# Patient Record
Sex: Male | Born: 1980
Health system: Southern US, Community
[De-identification: ages and names within clinical notes are randomized; demographics above are authoritative.]

## PROBLEM LIST (undated history)

## (undated) DIAGNOSIS — Z87448 Personal history of other diseases of urinary system: Secondary | ICD-10-CM

## (undated) DIAGNOSIS — J690 Pneumonitis due to inhalation of food and vomit: Secondary | ICD-10-CM

## (undated) DIAGNOSIS — F29 Unspecified psychosis not due to a substance or known physiological condition: Secondary | ICD-10-CM

## (undated) DIAGNOSIS — Z8719 Personal history of other diseases of the digestive system: Secondary | ICD-10-CM

## (undated) HISTORY — DX: Unspecified psychosis not due to a substance or known physiological condition: F29

## (undated) HISTORY — DX: Pneumonitis due to inhalation of food and vomit: J69.0

## (undated) HISTORY — DX: Personal history of other diseases of the digestive system: Z87.19

## (undated) HISTORY — DX: Personal history of other diseases of urinary system: Z87.448

---

## 2005-05-21 ENCOUNTER — Emergency Department (HOSPITAL_COMMUNITY): Admission: EM | Admit: 2005-05-21 | Discharge: 2005-05-21 | Payer: Self-pay | Admitting: Emergency Medicine

## 2005-05-23 ENCOUNTER — Emergency Department (HOSPITAL_COMMUNITY): Admission: EM | Admit: 2005-05-23 | Discharge: 2005-05-23 | Payer: Self-pay | Admitting: Emergency Medicine

## 2005-11-15 ENCOUNTER — Emergency Department (HOSPITAL_COMMUNITY): Admission: EM | Admit: 2005-11-15 | Discharge: 2005-11-15 | Payer: Self-pay | Admitting: *Deleted

## 2005-11-26 ENCOUNTER — Emergency Department (HOSPITAL_COMMUNITY): Admission: EM | Admit: 2005-11-26 | Discharge: 2005-11-26 | Payer: Self-pay | Admitting: Emergency Medicine

## 2007-03-04 ENCOUNTER — Emergency Department (HOSPITAL_COMMUNITY): Admission: EM | Admit: 2007-03-04 | Discharge: 2007-03-04 | Payer: Self-pay | Admitting: Emergency Medicine

## 2007-04-04 ENCOUNTER — Emergency Department (HOSPITAL_COMMUNITY): Admission: EM | Admit: 2007-04-04 | Discharge: 2007-04-05 | Payer: Self-pay | Admitting: Emergency Medicine

## 2007-10-21 ENCOUNTER — Ambulatory Visit: Payer: Self-pay | Admitting: Internal Medicine

## 2007-10-21 ENCOUNTER — Inpatient Hospital Stay (HOSPITAL_COMMUNITY): Admission: EM | Admit: 2007-10-21 | Discharge: 2007-11-07 | Payer: Self-pay | Admitting: Emergency Medicine

## 2007-10-21 ENCOUNTER — Ambulatory Visit: Payer: Self-pay | Admitting: *Deleted

## 2007-10-22 LAB — CONVERTED CEMR LAB
Cholesterol: 211 mg/dL
Triglycerides: 709 mg/dL

## 2007-10-29 ENCOUNTER — Ambulatory Visit: Payer: Self-pay | Admitting: Internal Medicine

## 2007-11-03 ENCOUNTER — Ambulatory Visit: Payer: Self-pay | Admitting: Internal Medicine

## 2007-11-07 ENCOUNTER — Ambulatory Visit: Payer: Self-pay | Admitting: Physical Medicine & Rehabilitation

## 2007-11-13 ENCOUNTER — Encounter (INDEPENDENT_AMBULATORY_CARE_PROVIDER_SITE_OTHER): Payer: Self-pay | Admitting: *Deleted

## 2007-11-13 DIAGNOSIS — E109 Type 1 diabetes mellitus without complications: Secondary | ICD-10-CM | POA: Insufficient documentation

## 2007-11-13 DIAGNOSIS — Z87448 Personal history of other diseases of urinary system: Secondary | ICD-10-CM

## 2007-11-13 DIAGNOSIS — Z8719 Personal history of other diseases of the digestive system: Secondary | ICD-10-CM | POA: Insufficient documentation

## 2007-11-14 ENCOUNTER — Encounter (INDEPENDENT_AMBULATORY_CARE_PROVIDER_SITE_OTHER): Payer: Self-pay | Admitting: *Deleted

## 2007-11-14 ENCOUNTER — Encounter: Payer: Self-pay | Admitting: Licensed Clinical Social Worker

## 2007-11-14 ENCOUNTER — Ambulatory Visit: Payer: Self-pay | Admitting: Internal Medicine

## 2007-11-14 DIAGNOSIS — R82998 Other abnormal findings in urine: Secondary | ICD-10-CM

## 2007-11-14 DIAGNOSIS — Z8659 Personal history of other mental and behavioral disorders: Secondary | ICD-10-CM

## 2007-11-14 DIAGNOSIS — J690 Pneumonitis due to inhalation of food and vomit: Secondary | ICD-10-CM

## 2007-11-14 LAB — CONVERTED CEMR LAB
ALT: 28 units/L (ref 0–53)
Basophils Absolute: 0 10*3/uL (ref 0.0–0.1)
Blood Glucose, Fingerstick: 220
CO2: 23 meq/L (ref 19–32)
Creatinine, Ser: 1.42 mg/dL (ref 0.40–1.50)
Eosinophils Relative: 2 % (ref 0–5)
Glucose, Bld: 206 mg/dL — ABNORMAL HIGH (ref 70–99)
HCT: 32 % — ABNORMAL LOW (ref 39.0–52.0)
Hemoglobin: 10.6 g/dL — ABNORMAL LOW (ref 13.0–17.0)
Lymphocytes Relative: 22 % (ref 12–46)
MCV: 80.2 fL (ref 78.0–100.0)
Microalb Creat Ratio: 13 mg/g (ref 0.0–30.0)
Monocytes Absolute: 0.5 10*3/uL (ref 0.1–1.0)
RDW: 15.2 % (ref 11.5–15.5)
Total Bilirubin: 0.6 mg/dL (ref 0.3–1.2)
Urine Glucose: NEGATIVE mg/dL
pH: 6 (ref 5.0–8.0)

## 2007-11-15 ENCOUNTER — Telehealth (INDEPENDENT_AMBULATORY_CARE_PROVIDER_SITE_OTHER): Payer: Self-pay | Admitting: *Deleted

## 2007-11-21 ENCOUNTER — Encounter (INDEPENDENT_AMBULATORY_CARE_PROVIDER_SITE_OTHER): Payer: Self-pay | Admitting: Internal Medicine

## 2007-11-21 ENCOUNTER — Ambulatory Visit: Payer: Self-pay | Admitting: Infectious Disease

## 2007-11-21 ENCOUNTER — Ambulatory Visit: Payer: Self-pay | Admitting: Internal Medicine

## 2007-11-21 ENCOUNTER — Encounter (INDEPENDENT_AMBULATORY_CARE_PROVIDER_SITE_OTHER): Payer: Self-pay | Admitting: *Deleted

## 2007-11-21 DIAGNOSIS — I10 Essential (primary) hypertension: Secondary | ICD-10-CM | POA: Insufficient documentation

## 2007-11-28 ENCOUNTER — Ambulatory Visit: Payer: Self-pay | Admitting: Internal Medicine

## 2007-11-28 LAB — CONVERTED CEMR LAB: Blood Glucose, Fingerstick: 101

## 2007-12-13 ENCOUNTER — Ambulatory Visit: Payer: Self-pay | Admitting: Internal Medicine

## 2007-12-13 ENCOUNTER — Encounter (INDEPENDENT_AMBULATORY_CARE_PROVIDER_SITE_OTHER): Payer: Self-pay | Admitting: Internal Medicine

## 2007-12-13 ENCOUNTER — Ambulatory Visit (HOSPITAL_COMMUNITY): Admission: RE | Admit: 2007-12-13 | Discharge: 2007-12-13 | Payer: Self-pay | Admitting: Internal Medicine

## 2007-12-13 DIAGNOSIS — I498 Other specified cardiac arrhythmias: Secondary | ICD-10-CM

## 2007-12-13 LAB — CONVERTED CEMR LAB
Calcium: 9.7 mg/dL (ref 8.4–10.5)
Creatinine, Ser: 1 mg/dL (ref 0.40–1.50)
Eosinophils Absolute: 0.2 10*3/uL (ref 0.0–0.7)
Eosinophils Relative: 2 % (ref 0–5)
Glucose, Bld: 150 mg/dL — ABNORMAL HIGH (ref 70–99)
HCT: 39.5 % (ref 39.0–52.0)
Lymphs Abs: 1.6 10*3/uL (ref 0.7–4.0)
MCV: 79.6 fL (ref 78.0–100.0)
Monocytes Absolute: 0.8 10*3/uL (ref 0.1–1.0)
Platelets: 275 10*3/uL (ref 150–400)
RDW: 13.7 % (ref 11.5–15.5)
Sodium: 140 meq/L (ref 135–145)
TSH: 1.057 microintl units/mL (ref 0.350–5.50)
WBC: 8.6 10*3/uL (ref 4.0–10.5)

## 2007-12-19 ENCOUNTER — Ambulatory Visit: Payer: Self-pay | Admitting: Internal Medicine

## 2007-12-19 ENCOUNTER — Telehealth (INDEPENDENT_AMBULATORY_CARE_PROVIDER_SITE_OTHER): Payer: Self-pay | Admitting: Pharmacy Technician

## 2007-12-19 LAB — CONVERTED CEMR LAB

## 2007-12-27 ENCOUNTER — Encounter (INDEPENDENT_AMBULATORY_CARE_PROVIDER_SITE_OTHER): Payer: Self-pay | Admitting: Internal Medicine

## 2008-02-05 ENCOUNTER — Encounter (INDEPENDENT_AMBULATORY_CARE_PROVIDER_SITE_OTHER): Payer: Self-pay | Admitting: Internal Medicine

## 2008-02-14 ENCOUNTER — Ambulatory Visit: Payer: Self-pay | Admitting: Internal Medicine

## 2008-02-17 ENCOUNTER — Encounter: Payer: Self-pay | Admitting: Internal Medicine

## 2008-07-03 ENCOUNTER — Telehealth (INDEPENDENT_AMBULATORY_CARE_PROVIDER_SITE_OTHER): Payer: Self-pay | Admitting: Internal Medicine

## 2008-07-21 ENCOUNTER — Ambulatory Visit: Payer: Self-pay | Admitting: Internal Medicine

## 2008-07-21 ENCOUNTER — Encounter (INDEPENDENT_AMBULATORY_CARE_PROVIDER_SITE_OTHER): Payer: Self-pay | Admitting: Internal Medicine

## 2008-07-21 LAB — CONVERTED CEMR LAB
Blood Glucose, Fingerstick: 162
Hgb A1c MFr Bld: 8 %

## 2008-07-23 LAB — CONVERTED CEMR LAB
CO2: 17 meq/L — ABNORMAL LOW (ref 19–32)
Calcium: 9.3 mg/dL (ref 8.4–10.5)
Chloride: 102 meq/L (ref 96–112)
Glucose, Bld: 156 mg/dL — ABNORMAL HIGH (ref 70–99)
Potassium: 4.3 meq/L (ref 3.5–5.3)
Sodium: 138 meq/L (ref 135–145)

## 2008-07-24 ENCOUNTER — Ambulatory Visit: Payer: Self-pay | Admitting: Internal Medicine

## 2008-07-24 ENCOUNTER — Encounter (INDEPENDENT_AMBULATORY_CARE_PROVIDER_SITE_OTHER): Payer: Self-pay | Admitting: Internal Medicine

## 2008-07-25 LAB — CONVERTED CEMR LAB
ALT: 38 units/L (ref 0–53)
Alkaline Phosphatase: 52 units/L (ref 39–117)
Basophils Absolute: 0 10*3/uL (ref 0.0–0.1)
Basophils Relative: 0 % (ref 0–1)
Bilirubin Urine: NEGATIVE
Creatinine, Ser: 1.2 mg/dL (ref 0.40–1.50)
Eosinophils Absolute: 0.1 10*3/uL (ref 0.0–0.7)
Eosinophils Relative: 1 % (ref 0–5)
HCT: 45 % (ref 39.0–52.0)
Hemoglobin, Urine: NEGATIVE
Hemoglobin: 14.4 g/dL (ref 13.0–17.0)
MCHC: 32 g/dL (ref 30.0–36.0)
MCV: 76.3 fL — ABNORMAL LOW (ref 78.0–100.0)
Monocytes Absolute: 0.4 10*3/uL (ref 0.1–1.0)
Platelets: 271 10*3/uL (ref 150–400)
Protein, ur: NEGATIVE mg/dL
RDW: 13.7 % (ref 11.5–15.5)
Sodium: 136 meq/L (ref 135–145)
Total Bilirubin: 0.7 mg/dL (ref 0.3–1.2)
Total Protein: 6.7 g/dL (ref 6.0–8.3)
Urine Glucose: 100 mg/dL — AB
pH: 6 (ref 5.0–8.0)

## 2008-08-11 ENCOUNTER — Encounter (INDEPENDENT_AMBULATORY_CARE_PROVIDER_SITE_OTHER): Payer: Self-pay | Admitting: Internal Medicine

## 2008-08-13 ENCOUNTER — Ambulatory Visit: Payer: Self-pay | Admitting: Internal Medicine

## 2008-08-13 ENCOUNTER — Encounter (INDEPENDENT_AMBULATORY_CARE_PROVIDER_SITE_OTHER): Payer: Self-pay | Admitting: Internal Medicine

## 2008-08-13 LAB — CONVERTED CEMR LAB: Hgb A1c MFr Bld: 8.9 %

## 2008-08-15 DIAGNOSIS — E785 Hyperlipidemia, unspecified: Secondary | ICD-10-CM

## 2008-08-15 LAB — CONVERTED CEMR LAB
AST: 25 units/L (ref 0–37)
Albumin: 4.7 g/dL (ref 3.5–5.2)
BUN: 16 mg/dL (ref 6–23)
Calcium: 9.9 mg/dL (ref 8.4–10.5)
Chloride: 103 meq/L (ref 96–112)
Cholesterol: 217 mg/dL — ABNORMAL HIGH (ref 0–200)
Creatinine, Ser: 1.02 mg/dL (ref 0.40–1.50)
Creatinine, Urine: 229.8 mg/dL
Glucose, Bld: 230 mg/dL — ABNORMAL HIGH (ref 70–99)
HDL: 35 mg/dL — ABNORMAL LOW (ref >39)
Potassium: 4.3 meq/L (ref 3.5–5.3)
Total CHOL/HDL Ratio: 6.2
Triglycerides: 251 mg/dL — ABNORMAL HIGH (ref <150)

## 2008-08-21 ENCOUNTER — Telehealth (INDEPENDENT_AMBULATORY_CARE_PROVIDER_SITE_OTHER): Payer: Self-pay | Admitting: Internal Medicine

## 2008-08-21 DIAGNOSIS — E872 Acidosis: Secondary | ICD-10-CM

## 2008-08-28 ENCOUNTER — Encounter (INDEPENDENT_AMBULATORY_CARE_PROVIDER_SITE_OTHER): Payer: Self-pay | Admitting: Internal Medicine

## 2008-08-28 ENCOUNTER — Ambulatory Visit: Payer: Self-pay | Admitting: *Deleted

## 2008-08-28 LAB — CONVERTED CEMR LAB
BUN: 16 mg/dL (ref 6–23)
CO2: 24 meq/L (ref 19–32)
Calcium: 9.5 mg/dL (ref 8.4–10.5)
Creatinine, Ser: 1.09 mg/dL (ref 0.40–1.50)
Glucose, Bld: 319 mg/dL — ABNORMAL HIGH (ref 70–99)

## 2008-09-10 ENCOUNTER — Telehealth (INDEPENDENT_AMBULATORY_CARE_PROVIDER_SITE_OTHER): Payer: Self-pay | Admitting: *Deleted

## 2008-09-10 ENCOUNTER — Ambulatory Visit: Payer: Self-pay | Admitting: *Deleted

## 2008-09-10 ENCOUNTER — Encounter (INDEPENDENT_AMBULATORY_CARE_PROVIDER_SITE_OTHER): Payer: Self-pay | Admitting: Internal Medicine

## 2008-09-10 LAB — CONVERTED CEMR LAB
BUN: 15 mg/dL (ref 6–23)
Blood Glucose, Fingerstick: 275
Calcium: 9.9 mg/dL (ref 8.4–10.5)
Chloride: 98 meq/L (ref 96–112)
Creatinine, Ser: 1.17 mg/dL (ref 0.40–1.50)

## 2008-10-13 ENCOUNTER — Encounter (INDEPENDENT_AMBULATORY_CARE_PROVIDER_SITE_OTHER): Payer: Self-pay | Admitting: Internal Medicine

## 2008-10-13 ENCOUNTER — Ambulatory Visit: Payer: Self-pay | Admitting: Internal Medicine

## 2008-10-13 LAB — CONVERTED CEMR LAB
Calcium: 9.8 mg/dL (ref 8.4–10.5)
Glucose, Bld: 252 mg/dL — ABNORMAL HIGH (ref 70–99)
Hgb A1c MFr Bld: 10.2 %
Potassium: 4.1 meq/L (ref 3.5–5.3)
Sodium: 134 meq/L — ABNORMAL LOW (ref 135–145)

## 2008-10-22 ENCOUNTER — Encounter (INDEPENDENT_AMBULATORY_CARE_PROVIDER_SITE_OTHER): Payer: Self-pay | Admitting: Internal Medicine

## 2008-11-14 ENCOUNTER — Telehealth (INDEPENDENT_AMBULATORY_CARE_PROVIDER_SITE_OTHER): Payer: Self-pay | Admitting: *Deleted

## 2008-11-26 ENCOUNTER — Telehealth: Payer: Self-pay | Admitting: *Deleted

## 2008-11-27 ENCOUNTER — Telehealth: Payer: Self-pay | Admitting: *Deleted

## 2008-12-10 ENCOUNTER — Encounter (INDEPENDENT_AMBULATORY_CARE_PROVIDER_SITE_OTHER): Payer: Self-pay | Admitting: Internal Medicine

## 2009-01-16 ENCOUNTER — Telehealth (INDEPENDENT_AMBULATORY_CARE_PROVIDER_SITE_OTHER): Payer: Self-pay | Admitting: Internal Medicine

## 2009-05-27 ENCOUNTER — Ambulatory Visit: Payer: Self-pay | Admitting: Internal Medicine

## 2009-05-27 LAB — CONVERTED CEMR LAB
Blood Glucose, Fingerstick: 368
Hgb A1c MFr Bld: 13.2 %

## 2009-05-28 ENCOUNTER — Encounter: Payer: Self-pay | Admitting: Internal Medicine

## 2009-05-28 DIAGNOSIS — R809 Proteinuria, unspecified: Secondary | ICD-10-CM | POA: Insufficient documentation

## 2009-05-28 LAB — CONVERTED CEMR LAB: Creatinine, Urine: 70.6 mg/dL

## 2009-06-11 ENCOUNTER — Ambulatory Visit: Payer: Self-pay | Admitting: Internal Medicine

## 2009-06-18 ENCOUNTER — Ambulatory Visit: Payer: Self-pay | Admitting: Internal Medicine

## 2009-06-22 ENCOUNTER — Encounter (INDEPENDENT_AMBULATORY_CARE_PROVIDER_SITE_OTHER): Payer: Self-pay | Admitting: Internal Medicine

## 2009-09-10 ENCOUNTER — Encounter (INDEPENDENT_AMBULATORY_CARE_PROVIDER_SITE_OTHER): Payer: Self-pay | Admitting: Internal Medicine

## 2009-12-11 ENCOUNTER — Ambulatory Visit: Payer: Self-pay | Admitting: Internal Medicine

## 2009-12-11 LAB — CONVERTED CEMR LAB: Hgb A1c MFr Bld: 9.5 %

## 2010-01-11 ENCOUNTER — Encounter (INDEPENDENT_AMBULATORY_CARE_PROVIDER_SITE_OTHER): Payer: Self-pay | Admitting: Internal Medicine

## 2010-01-11 ENCOUNTER — Ambulatory Visit: Payer: Self-pay | Admitting: Internal Medicine

## 2010-01-11 LAB — CONVERTED CEMR LAB
ALT: 19 units/L (ref 0–53)
AST: 13 units/L (ref 0–37)
Albumin: 4.3 g/dL (ref 3.5–5.2)
BUN: 11 mg/dL (ref 6–23)
CO2: 21 meq/L (ref 19–32)
Calcium: 9.5 mg/dL (ref 8.4–10.5)
Chloride: 102 meq/L (ref 96–112)
Potassium: 4 meq/L (ref 3.5–5.3)

## 2010-01-13 ENCOUNTER — Encounter (INDEPENDENT_AMBULATORY_CARE_PROVIDER_SITE_OTHER): Payer: Self-pay | Admitting: Internal Medicine

## 2010-02-15 ENCOUNTER — Telehealth (INDEPENDENT_AMBULATORY_CARE_PROVIDER_SITE_OTHER): Payer: Self-pay | Admitting: *Deleted

## 2010-02-15 ENCOUNTER — Ambulatory Visit: Payer: Self-pay | Admitting: Internal Medicine

## 2010-02-15 LAB — CONVERTED CEMR LAB: Blood Glucose, Fingerstick: 345

## 2010-02-17 ENCOUNTER — Telehealth: Payer: Self-pay | Admitting: Licensed Clinical Social Worker

## 2010-04-14 ENCOUNTER — Telehealth (INDEPENDENT_AMBULATORY_CARE_PROVIDER_SITE_OTHER): Payer: Self-pay | Admitting: *Deleted

## 2010-05-07 ENCOUNTER — Ambulatory Visit: Payer: Self-pay | Admitting: Internal Medicine

## 2010-05-07 DIAGNOSIS — F17200 Nicotine dependence, unspecified, uncomplicated: Secondary | ICD-10-CM | POA: Insufficient documentation

## 2010-05-07 LAB — CONVERTED CEMR LAB
Alkaline Phosphatase: 56 units/L (ref 39–117)
BUN: 9 mg/dL (ref 6–23)
Glucose, Bld: 133 mg/dL — ABNORMAL HIGH (ref 70–99)
Sodium: 138 meq/L (ref 135–145)
Total Bilirubin: 0.4 mg/dL (ref 0.3–1.2)

## 2010-05-12 ENCOUNTER — Telehealth: Payer: Self-pay | Admitting: Licensed Clinical Social Worker

## 2010-06-22 ENCOUNTER — Encounter: Payer: Self-pay | Admitting: Internal Medicine

## 2010-07-15 ENCOUNTER — Telehealth: Payer: Self-pay | Admitting: Internal Medicine

## 2010-07-17 ENCOUNTER — Emergency Department (HOSPITAL_COMMUNITY): Admission: EM | Admit: 2010-07-17 | Discharge: 2010-07-17 | Payer: Self-pay | Admitting: Emergency Medicine

## 2010-11-23 NOTE — Assessment & Plan Note (Signed)
Summary: CHECKUP/SB.   Vital Signs:  Patient profile:   30 year old male Height:      73 inches (185.42 cm) Weight:      292.8 pounds (133.09 kg) BMI:     38.77 Temp:     98.2 degrees F (36.78 degrees C) oral Pulse rate:   92 / minute BP sitting:   122 / 83  (right arm) Cuff size:   X LARGE  Vitals Entered By: Theotis Barrio NT II (December 11, 2009 3:10 PM) CC: ROUTINE OFFICE VISIT / NEED REFILL ON INSULIN  /  NO CONCERNS NOR COMPLAINTS,  Is Patient Diabetic? Yes Did you bring your meter with you today? No Pain Assessment Patient in pain? no      Nutritional Status BMI of > 30 = obese CBG Result 271  Have you ever been in a relationship where you felt threatened, hurt or afraid?No   Does patient need assistance? Functional Status Self care Ambulation Normal Comments ROUTINE OFFICE VISIT /  NEED REFILL ON INSULIN / NO CONCERNS NOR COMPLAINTS   Primary Care Provider:  Zara Council MD  CC:  ROUTINE OFFICE VISIT / NEED REFILL ON INSULIN  /  NO CONCERNS NOR COMPLAINTS and .  History of Present Illness: Connor Cook is a 30 year old Male with PMH/problems as outlined in the EMR, who presents to the [facility] for follow up on his DM. He is taking lantus and novolog as prescribed but not doing other meds, because he couldn't go to [facility] to pick them up. Feels fine and has no other complaints.   Depression History:      The patient denies a depressed mood most of the day and a diminished interest in his usual daily activities.         Preventive Screening-Counseling & Management  Alcohol-Tobacco     Alcohol type: AT TIMES  /BEER     Smoking Status: quit     Packs/Day: 1PER DAY     Year Started: AT THE AGE OF 12     Year Quit: 2008  Caffeine-Diet-Exercise     Does Patient Exercise: yes     Type of exercise: WALKING     Times/week: 1-2  Current Medications (verified): 1)  Lantus 100 Unit/ml  Soln (Insulin Glargine) .... Inject 50 Units Subcutaneously Every Night At  Bedtime For Diabetes. 2)  Fluphenazine Hcl 2.5 Mg/26ml  Elix (Fluphenazine Hcl) .... Take 0.8ml At Intervals Directed By [facility]. 3)  Novolog 100 Unit/ml  Soln (Insulin Aspart) .Marland Kitchen.. 10 Units Before Breakfast and Dinner, Subcutaneously. 4)  Lisinopril 10 Mg Tabs (Lisinopril) .... Take 1 Tablet By Mouth Once A Day 5)  Pravachol 20 Mg Tabs (Pravastatin Sodium) .... Take 1 Tab By Mouth At Bedtime  Allergies: No Known Drug Allergies  Past History:  Past Medical History: Last updated: 10/13/2008 Diabetes mellitus, type I (dx Dec 2008) w/ episode of DKA H/o acute pancreatitis H/o upper GI bleed H/o aspiration pneumonia H/o acute renal failure Psychosis (receives fluphenazine injections every 2 weeks at [facility]; contact is Merryl Hacker, labeled as Psychotic Disorder NOS).  Family History: Last updated: 11/14/2007 Mother: DM, HTN Father: deceased (age 32) of MI Siblings: 1 brother, 2 sisters Children: none  Social History: Last updated: 08/13/2008 Lives with mom in [place].  Quit few months (1ppd x 2-3y).  Drank 2-3 drinks on weekend.  No drugs.  Has a girlfriend.  Risk Factors: Exercise: yes (12/11/2009)  Risk Factors: Smoking  Status: quit (12/11/2009) Packs/Day: 1PER DAY (12/11/2009)  Review of Systems       Review of System: Negative except per HPI.    Physical Exam  General:  alert and overweight-appearing.   Head:  normocephalic and atraumatic.   Eyes:  vision grossly intact, pupils equal, pupils round, and pupils reactive to light.   Mouth:  pharynx pink and moist.   Neck:  supple.   Lungs:  normal respiratory effort and normal breath sounds.   Heart:  normal rate and regular rhythm.   Abdomen:  soft and non-tender.   Pulses:  normal peripheral pulses  Extremities:  no cyanosis, clubbing or edema  dm foot exam done today.    Impression & Recommendations:  Problem # 1:  DIABETES MELLITUS, TYPE I (ICD-250.01) Data reviewed: A1c:  9.5 (12/11/2009 3:04:05 PM)  MICROALB/CR: 420.8 (05/28/2009 1:10:00 AM) BP:  borderline LDL: 132 (08/13/2008 8:46:00 PM) EYE: No diabetic retinopathy.   Visual acuity OD : 20/20 Visual acuity OS : 20/20      Intraocular pressure OD: 20 exam by Dr. Ernst Breach. Bulakowski       (12/10/2008 10:04:32 AM) Foot exam done today.  WEIGHT: 38.77 (12/11/2009 3:04:05 PM)   Patient did not bring his meter today. I am unable to make changes without looking at his CBG numbers. I have asked the patient to bring his meters next time. His updated medication list for this problem includes:    Lantus 100 Unit/ml Soln (Insulin glargine) ..... Inject 50 units subcutaneously every night at bedtime for diabetes.    Novolog 100 Unit/ml Soln (Insulin aspart) .Marland KitchenMarland KitchenMarland KitchenMarland Kitchen 10 units before breakfast and dinner, subcutaneously.    Lisinopril 10 Mg Tabs (Lisinopril) .Marland Kitchen... Take 1 tablet by mouth once a day  Orders: T- Capillary Blood Glucose (82948) T-Hgb A1C (in-house) ([address]UE)  Problem # 2:  HYPERTENSION (ICD-401.9) BP is above ideal. Not taking lisinopril. I have counselled him on this. He is going to find somebody who can drive him to [facility] to help get the meds.  His updated medication list for this problem includes:    Lisinopril 10 Mg Tabs (Lisinopril) .Marland Kitchen... Take 1 tablet by mouth once a day  Problem # 3:  DYSLIPIDEMIA (ICD-272.4) continue statin.   His updated medication list for this problem includes:    Pravachol 20 Mg Tabs (Pravastatin sodium) .Marland Kitchen... Take 1 tab by mouth at bedtime  Complete Medication List: 1)  Lantus 100 Unit/ml Soln (Insulin glargine) .... Inject 50 units subcutaneously every night at bedtime for diabetes. 2)  Fluphenazine Hcl 2.5 Mg/88ml Elix (Fluphenazine hcl) .... Take 0.31ml at intervals directed by [facility]. 3)  Novolog 100 Unit/ml Soln (Insulin aspart) .Marland Kitchen.. 10 units before breakfast and dinner, subcutaneously. 4)  Lisinopril 10 Mg Tabs (Lisinopril) .... Take 1  tablet by mouth once a day 5)  Pravachol 20 Mg Tabs (Pravastatin sodium) .... Take 1 tab by mouth at bedtime  Patient Instructions: 1)  Please schedule a follow-up appointment in 1 month. 2)  Please take Lisinopril as prescribed for blood pressure. Available at [facility] FOR $4 PER MONTH.  Prescriptions: PRAVACHOL 20 MG TABS (PRAVASTATIN SODIUM) Take 1 tab by mouth at bedtime  #31 x 6   Entered and Authorized by:   Zara Council MD   Signed by:   Zara Council MD on 12/11/2009   Method used:   Electronically to        [facility] (retail)  [address]       [place], [place]  [address]       Ph: [phone]       Fax: [phone]   RxID:   [id number] LISINOPRIL 10 MG TABS (LISINOPRIL) Take 1 tablet by mouth once a day  #31 x 6   Entered and Authorized by:   Zara Council MD   Signed by:   Zara Council MD on 12/11/2009   Method used:   Electronically to        [facility] (retail)       [address]       [place], [place]  [address]       Ph: [phone]       Fax: [phone]   RxID:   [id number] NOVOLOG 100 UNIT/ML  SOLN (INSULIN ASPART) 10 units before breakfast and dinner, subcutaneously.  #1 vial x 6   Entered and Authorized by:   Zara Council MD   Signed by:   Zara Council MD on 12/11/2009   Method used:   Faxed to ...       [facility] (retail)       [address] [place], [place]  [address]       Ph: [phone]       Fax: [phone]   RxID:   [id number]    Prevention & Chronic Care Immunizations   Influenza vaccine: Fluvax 3+  (07/21/2008)    Tetanus booster: Not documented    Pneumococcal vaccine: Not documented  Other Screening   Smoking status: quit  (12/11/2009)  Diabetes Mellitus   HgbA1C: 9.5  (12/11/2009)   Hemoglobin A1C due: 03/11/2008    Eye exam: No diabetic retinopathy.   Visual acuity OD : 20/20 Visual acuity OS : 20/20       Intraocular pressure OD: 20 exam by Dr. Liliane Bade        (12/10/2008)   Eye exam due: 12/2009    Foot exam: Not documented   High risk foot: No  (12/11/2009)   Foot care education: Done  (11/21/2007)   Foot exam due: 12/10/2010    Urine microalbumin/creatinine ratio: 420.8  (05/28/2009)   Urine microalbumin action/deferral: Ordered   Urine microalbumin/cr due: 11/13/2008    Diabetes flowsheet reviewed?: Yes   Progress toward A1C goal: Unchanged  Lipids   Total Cholesterol: 217  (08/13/2008)   LDL: 132  (08/13/2008)   LDL Direct: Not documented   HDL: 35  (08/13/2008)   Triglycerides: 251  (08/13/2008)    SGOT (AST): 25  (08/13/2008)   SGPT (ALT): 36  (08/13/2008)   Alkaline phosphatase: 56  (08/13/2008)   Total bilirubin: 0.4  (08/13/2008)    Lipid flowsheet reviewed?: Yes   Progress toward LDL goal: Unchanged  Hypertension   Last Blood Pressure: 122 / 83  (12/11/2009)   Serum creatinine: 1.04  (10/13/2008)   Serum potassium 4.1  (10/13/2008)    Hypertension flowsheet reviewed?: Yes   Progress toward BP goal: Deteriorated  Self-Management Support :    Patient will work on the following items until the next clinic visit to reach self-care goals:     Medications and monitoring: take my medicines every day, check my blood sugar, examine my feet every day  (12/11/2009)     Eating: drink diet soda or water instead of juice  or soda, eat more vegetables, eat foods that are low in salt, eat baked foods instead of fried foods, eat fruit for snacks and desserts, limit or avoid alcohol  (12/11/2009)     Activity: take a 30 minute walk every day  (12/11/2009)    Diabetes self-management support: Not documented   Last diabetes self-management training by diabetes educator: 06/18/2009   Last medical nutrition therapy: 12/19/2007    Hypertension self-management support: Not documented    Lipid self-management support: Not documented   Laboratory Results   Blood  Tests   Date/Time Received: December 11, 2009 3:34 PM  Date/Time Reported: Burke Keels  December 11, 2009 3:34 PM   HGBA1C: 9.5%   (Normal Range: Non-Diabetic - 3-6%   Control Diabetic - 6-8%) CBG Random:: 271mg /dL      Last LDL:                                                 132 (08/13/2008 8:46:00 PM)        Diabetic Foot Exam Foot Inspection Is there a history of a foot ulcer?              No Is there a foot ulcer now?              No Can the patient see the bottom of their feet?          Yes Are the shoes appropriate in style and fit?          Yes Is there swelling or an abnormal foot shape?          No Are the toenails long?                No Are the toenails thick?                No Are the toenails ingrown?              No Is there heavy callous build-up?              No Is there a claw toe deformity?                          No Is there elevated skin temperature?            No Is there limited ankle dorsiflexion?            No Is there foot or ankle muscle weakness?            No Do you have pain in calf while walking?           No       High Risk Feet? No Set Next Diabetic Foot Exam here: 12/10/2010   10-g (5.07) Semmes-Weinstein Monofilament Test Performed by: Etana Beets MD          Right Foot          Left Foot Site 1         normal         normal Site 4         normal         normal Site 5         normal         normal Site 6  normal         normal

## 2010-11-23 NOTE — Progress Notes (Signed)
Summary: Soc. 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       Appended Document: Soc. Work Patient Connor Cook that was scheduled July 21st.

## 2010-11-23 NOTE — Letter (Signed)
Summary: DIABETIC EYE EXAMINATION REPORT  DIABETIC EYE EXAMINATION REPORT   Imported By: Margie Billet 01/28/2010 11:11:59  _____________________________________________________________________  External Attachment:    Type:   Image     Comment:   External Document  Appended Document: DIABETIC EYE EXAMINATION REPORT    Clinical Lists Changes  Observations: Added new observation of [id number]: 01/2011 (02/01/2010 8:35) Added new observation of DIAB EYE EX: No diabetic retinopathy.    (01/13/2010 8:36)       Diabetic Eye Exam  Procedure date:  01/13/2010  Findings:      No diabetic retinopathy.     Procedures Next Due Date:    Diabetic Eye Exam: 01/2011   Diabetic Eye Exam  Procedure date:  01/13/2010  Findings:      No diabetic retinopathy.     Procedures Next Due Date:    Diabetic Eye Exam: 01/2011

## 2010-11-23 NOTE — Assessment & Plan Note (Signed)
Summary: REASSIGNED-DIABETES CHECK UP/CFB   Vital Signs:  Patient profile:   30 year old male Height:      73 inches (185.42 cm) Weight:      286.7 pounds (130.32 kg) BMI:     37.96 Temp:     98.5 degrees F (36.94 degrees C) oral Pulse rate:   80 / minute BP sitting:   129 / 86  (right arm) Cuff size:   large  Vitals Entered By: Theotis Barrio NT II (May 07, 2010 4:19 PM) CC: PATIENTNT IS HERE FOR ROUTINE OFFICE VISIT Is Patient Diabetic? Yes Did you bring your meter with you today? No Pain Assessment Patient in pain? no      Nutritional Status BMI of > 30 = obese  Have you ever been in a relationship where you felt threatened, hurt or afraid?No   Does patient need assistance? Functional Status Self care Ambulation Normal Comments ROUTINE OFFICE VISIT    \]\  Primary Care Provider:  Lars Mage MD  CC:  PATIENTNT IS HERE FOR ROUTINE OFFICE VISIT.  History of Present Illness: Connor Cook is a 30 year old Male with PMH/problems as outlined in the EMR, who presents to the [facility] for follow up on his high BP and DM. He is taking his BP meds as prescribed and doing lantus once a day and novolog with meals twice day. No new complaints today. Not checking his blood sugar.  Patient was counselled on smoking cessation strategies including medications and behavior modification options. I will give him nicotine patches today.  He says that he is having cough for last 2 months, does not bring anything with it and has been getting better. He smokes about a pack per day.  No other complaints.     Preventive Screening-Counseling & Management  Alcohol-Tobacco     Alcohol type: AT TIMES  /BEER     Smoking Status: current     Smoking Cessation Counseling: yes     Packs/Day: 1PER DAY     Year Started: AT THE AGE OF 12     Year Quit: 2008  Caffeine-Diet-Exercise     Does Patient Exercise: yes     Type of exercise: WALKING     Times/week: 1-2  Current Medications  (verified): 1)  Lantus 100 Unit/ml  Soln (Insulin Glargine) .... Inject 50 Units Subcutaneously Every Night At Bedtime For Diabetes. 2)  Fluphenazine Hcl 2.5 Mg/36ml  Elix (Fluphenazine Hcl) .... Take 0.54ml At Intervals Directed By [facility]. 3)  Novolog 100 Unit/ml  Soln (Insulin Aspart) .Marland Kitchen.. 10 Units Before Breakfast and Dinner, Subcutaneously; and Add 2 Units Insulin For Every Handful of Chips Consumed. 4)  Lisinopril 10 Mg Tabs (Lisinopril) .... Take 1 Tablet By Mouth Once A Day 5)  Pravachol 20 Mg Tabs (Pravastatin Sodium) .... Take 1 Tab By Mouth At Bedtime 6)  Truetrack Test  Strp (Glucose Blood) .... Please Use It To Check Blood Sugar 2-3 Times A Day (Early Morning and Before and After Meals) 7)  Nicotine 21 Mg/24hr Pt24 (Nicotine) .... Apply To Your Skin and Change Every Day 8)  Nicotine 14 Mg/24hr Pt24 (Nicotine) .... Apply To Your Skin and Change Every Day For Next 7 Days. 9)  Nicotine 7 Mg/24hr Pt24 (Nicotine) .... Marland Kitchenapply Toskin As Directed After 14 Days of Other Patches For Next 14 Days.  Allergies (verified): No Known Drug Allergies  Family History: Reviewed history from 11/14/2007 and no changes required. Mother: DM, HTN Father: deceased (age  51) of MI Siblings: 1 brother, 2 sisters Children: none  Social History: Reviewed history from 08/13/2008 and no changes required. Lives with mom in [place].  Quit few months (1ppd x 2-3y).  Drank 2-3 drinks on weekend.  No drugs.  Has a girlfriend.Smoking Status:  current  Review of Systems      See HPI  Physical Exam  Additional Exam:  Gen: AOx3, in no acute distress Eyes: PERRL, EOMI ENT:MMM, No erythema noted in posterior pharynx Neck: No JVD, No LAP Chest: CTAB with  good respiratory effort CVS: regular rhythmic rate, NO M/R/G, S1 S2 normal Abdo: soft,ND, BS+x4, Non tender and No hepatosplenomegaly EXT: No odema noted Neuro: Non focal, gait is normal Skin: no rashes noted.    Impression &  Recommendations:  Problem # 1:  TOBACCO ABUSE (ICD-305.1) Assessment Comment Only Patient was counseled on smoking cessation strategies including medications and behavior modification options. He wants to try nicotine patches, he quit in 2008 and thinks that he can do it again.  Social worker referral given but she was not available at the time. She will call her later and set up an appointment.  His updated medication list for this problem includes:    Nicotine 21 Mg/24hr Pt24 (Nicotine) .Marland Kitchen... Apply to your skin and change every day    Nicotine 14 Mg/24hr Pt24 (Nicotine) .Marland Kitchen... Apply to your skin and change every day for next 7 days.    Nicotine 7 Mg/24hr Pt24 (Nicotine) ..... Marland Kitchenapply toskin as directed after 14 days of other patches for next 14 days.  Orders: Social Work Referral (Social )  Encouraged smoking cessation and discussed different methods for smoking cessation.   Problem # 2:  DIABETES MELLITUS, TYPE I (ICD-250.01) Assessment: Improved Patient is doing better with HBa1c 7.7 today. Optha exam recent. Already on Lisinopril. Foot exam done today with sensations and pulses normal. BP is well maintained.  His updated medication list for this problem includes:    Lantus 100 Unit/ml Soln (Insulin glargine) ..... Inject 50 units subcutaneously every night at bedtime for diabetes.    Novolog 100 Unit/ml Soln (Insulin aspart) .Marland KitchenMarland KitchenMarland KitchenMarland Kitchen 10 units before breakfast and dinner, subcutaneously; and add 2 units insulin for every handful of chips consumed.    Lisinopril 10 Mg Tabs (Lisinopril) .Marland Kitchen... Take 1 tablet by mouth once a day  Orders: T-Hgb A1C (in-house) (17510CH) T-CMP with Estimated GFR ([id number])  Labs Reviewed: Creat: 0.94 (01/11/2010)     Last Eye Exam: No diabetic retinopathy.    (01/13/2010) Reviewed HgBA1c results: 7.7 (05/07/2010)  9.5 (12/11/2009)  Problem # 3:  HYPERTENSION (ICD-401.9) Patient is taking Lisionopril and BP is well maintained at this time.  Diastolic is above the goal for a diabetic but I will not any meds at this time as the new guidelines are due.  His updated medication list for this problem includes:    Lisinopril 10 Mg Tabs (Lisinopril) .Marland Kitchen... Take 1 tablet by mouth once a day  Orders: T-CMP with Estimated GFR ([id number])  BP today: 129/86 Prior BP: 122/81 (02/15/2010)  Labs Reviewed: K+: 4.0 (01/11/2010) Creat: : 0.94 (01/11/2010)   Chol: 217 (08/13/2008)   HDL: 35 (08/13/2008)   LDL: 132 (08/13/2008)   TG: 251 (08/13/2008)  Problem # 4:  Preventive Health Care (ICD-V70.0) Assessment: Comment Only  Reviewed preventive care protocols, scheduled due services, and updated immunizations.  Complete Medication List: 1)  Lantus 100 Unit/ml Soln (Insulin glargine) .... Inject 50 units subcutaneously every night at bedtime for diabetes. 2)  Fluphenazine Hcl 2.5 Mg/10ml Elix (Fluphenazine hcl) .... Take 0.62ml at intervals directed by [facility]. 3)  Novolog 100 Unit/ml Soln (Insulin aspart) .Marland Kitchen.. 10 units before breakfast and dinner, subcutaneously; and add 2 units insulin for every handful of chips consumed. 4)  Lisinopril 10 Mg Tabs (Lisinopril) .... Take 1 tablet by mouth once a day 5)  Pravachol 20 Mg Tabs (Pravastatin sodium) .... Take 1 tab by mouth at bedtime 6)  Truetrack Test Strp (Glucose blood) .... Please use it to check blood sugar 2-3 times a day (early morning and before and after meals) 7)  Nicotine 21 Mg/24hr Pt24 (Nicotine) .... Apply to your skin and change every day 8)  Nicotine 14 Mg/24hr Pt24 (Nicotine) .... Apply to your skin and change every day for next 7 days. 9)  Nicotine 7 Mg/24hr Pt24 (Nicotine) .... Marland Kitchenapply toskin as directed after 14 days of other patches for next 14 days.  Patient Instructions: 1)  Please schedule a follow-up appointment in 3 months. 2)  Limit your Sodium (Salt). 3)  1800-QUITNOW  is the helpline for smoking. 4)  Tobacco is very bad for your health and your  loved ones! You Should stop smoking!. 5)  Stop Smoking Tips: Choose a Quit date. Cut down before the Quit date. decide what you will do as a substitute when you feel the urge to smoke(gum,toothpick,exercise). 6)  It is important that you exercise regularly at least 20 minutes 5 times a week. If you develop chest pain, have severe difficulty breathing, or feel very tired , stop exercising immediately and seek medical attention. 7)  You need to lose weight. Consider a lower calorie diet and regular exercise.  8)  Check your blood sugars regularly. If your readings are usually above : or below 70 you should contact our office. 9)  It is important that your Diabetic A1c level is checked every 3 months. 10)  See your eye doctor yearly to check for diabetic eye damage. 11)  Check your feet each night for sore areas, calluses or signs of infection. 12)  Check your Blood Pressure regularly. If it is above: you should make an appointment. 13)  Acute bronchitis symptoms for less than 10 days are not helped by antibiotics. take over the counter cough medications. call if no improvment in  5-7 days, sooner if increasing cough, fever, or new symptoms( shortness of breath, chest pain). 14)  Lipid Panel prior to visit, ICD-9: 15)  HbgA1C prior to visit, ICD-9: Prescriptions: NICOTINE 7 MG/24HR PT24 (NICOTINE) .apply toskin as directed after 14 days of other patches for next 14 days.  #14 x 0   Entered and Authorized by:   Lars Mage MD   Signed by:   Lars Mage MD on 05/07/2010   Method used:   Print then Give to Patient   RxID:   [id number] NICOTINE 14 MG/24HR PT24 (NICOTINE) apply to your skin and change every day for next 7 days.  #7 x 0   Entered and Authorized by:   Lars Mage MD   Signed by:   Lars Mage MD on 05/07/2010   Method used:   Print then Give to Patient   RxID:   [id number] NICOTINE 21 MG/24HR PT24 (NICOTINE) Apply to your skin and change every day  #7 x 0   Entered and  Authorized by:   Lars Mage MD   Signed by:   Lars Mage MD on 05/07/2010   Method used:   Print then  Give to Patient   RxID:   [id number]  Process Orders Check Orders Results:     [facility]: ABN not required for this insurance Tests Sent for requisitioning (May 10, 2010 9:46 AM):     05/07/2010: [facility] -- T-CMP with Estimated GFR [[id number]] (signed)    Prevention & Chronic Care Immunizations   Influenza vaccine: Fluvax 3+  (07/21/2008)   Influenza vaccine deferral: Deferred  (05/07/2010)    Tetanus booster: Not documented   Td booster deferral: Deferred  (05/07/2010)    Pneumococcal vaccine: Not documented  Other Screening   Smoking status: current  (05/07/2010)   Smoking cessation counseling: yes  (05/07/2010)  Diabetes Mellitus   HgbA1C: 7.7  (05/07/2010)   Hemoglobin A1C due: 03/11/2008    Eye exam: No diabetic retinopathy.     (01/13/2010)   Eye exam due: 01/2011    Foot exam: Not documented   Foot exam action/deferral: Deferred   High risk foot: No  (12/11/2009)   Foot care education: Done  (11/21/2007)   Foot exam due: 12/10/2010    Urine microalbumin/creatinine ratio: 420.8  (05/28/2009)   Urine microalbumin action/deferral: Ordered   Urine microalbumin/cr due: 11/13/2008    Diabetes flowsheet reviewed?: Yes   Progress toward A1C goal: At goal  Lipids   Total Cholesterol: 217  (08/13/2008)   Lipid panel action/deferral: Deferred   LDL: 132  (08/13/2008)   LDL Direct: Not documented   HDL: 35  (08/13/2008)   Triglycerides: 251  (08/13/2008)    SGOT (AST): 13  (01/11/2010)   SGPT (ALT): 19  (01/11/2010)   Alkaline phosphatase: 53  (01/11/2010)   Total bilirubin: 0.5  (01/11/2010)    Lipid flowsheet reviewed?: Yes   Progress toward LDL goal: At goal  Hypertension   Last Blood Pressure: 129 / 86  (05/07/2010)   Serum creatinine: 0.94  (01/11/2010)   Serum potassium 4.0  (01/11/2010)     Hypertension flowsheet reviewed?: Yes   Progress toward BP goal: At goal  Self-Management Support :   Personal Goals (by the next clinic visit) :     Personal A1C goal: 6  (01/11/2010)     Personal blood pressure goal: 130/80  (01/11/2010)     Personal LDL goal: 100  (01/11/2010)    Patient will work on the following items until the next clinic visit to reach self-care goals:     Medications and monitoring: take my medicines every day, check my blood sugar, examine my feet every day  (05/07/2010)     Eating: drink diet soda or water instead of juice or soda, eat more vegetables, use fresh or frozen vegetables, eat foods that are low in salt, eat baked foods instead of fried foods, eat fruit for snacks and desserts, limit or avoid alcohol  (05/07/2010)     Activity: take the stairs instead of the elevator  (05/07/2010)    Diabetes self-management support: Resources for patients handout, Written self-care plan  (05/07/2010)   Diabetes care plan printed   Last diabetes self-management training by diabetes educator: 06/18/2009   Last medical nutrition therapy: 12/19/2007    Hypertension self-management support: Resources for patients handout, Written self-care plan  (05/07/2010)   Hypertension self-care plan printed.    Lipid self-management support: Resources for patients handout, Written self-care plan  (05/07/2010)   Lipid self-care plan printed.      Resource handout printed.  Process Orders Check Orders Results:     [facility]: ABN not required  for this insurance Tests Sent for requisitioning (May 10, 2010 9:46 AM):     05/07/2010: [facility] -- T-CMP with Estimated GFR [[id number]] (signed)      Laboratory Results   Blood Tests   Date/Time Received: May 07, 2010 5:03 PM  Date/Time Reported: Burke Keels  May 07, 2010 5:03 PM   HGBA1C: 7.7%   (Normal Range: Non-Diabetic - 3-6%   Control Diabetic - 6-8%)

## 2010-11-23 NOTE — Miscellaneous (Signed)
Summary: [facility]   Imported By: Margie Billet 07/08/2010 13:37:27  _____________________________________________________________________  External Attachment:    Type:   Image     Comment:   External Document

## 2010-11-23 NOTE — Progress Notes (Signed)
Summary: med refill/gp  Phone Note Refill Request Message from:  Patient on July 15, 2010 12:05 PM  Refills Requested: Medication #1:  NOVOLOG 100 UNIT/ML  SOLN 10 units before breakfast and dinner   Dosage confirmed as above?Dosage Confirmed   Brand Name Necessary? No   Supply Requested: 1 year Last appt. July 15.   Method Requested: Electronic Initial call taken by: Chinita Pester RN,  July 15, 2010 12:05 PM  Follow-up for Phone Call        Refill approved-nurse to complete Follow-up by: Lars Mage MD,  July 19, 2010 8:31 AM    Prescriptions: NOVOLOG 100 UNIT/ML  SOLN (INSULIN ASPART) 10 units before breakfast and dinner, subcutaneously; and add 2 units insulin for every handful of chips consumed.  #1 x prn   Entered and Authorized by:   Lars Mage MD   Signed by:   Lars Mage MD on 07/19/2010   Method used:   [facility]       [address]       [place], [place]  [address]       Ph: [phone]       Fax: [phone]   RxID:   [id number]

## 2010-11-23 NOTE — Miscellaneous (Signed)
Summary: Orders Update  Clinical Lists Changes  Orders: Added new Referral order of Ophthalmology Referral (Ophthalmology) - Signed    PATIENT HAS APPT  WITH [facility] 3/23/011 @ 10:30AM Connor Cook NTII

## 2010-11-23 NOTE — Progress Notes (Signed)
Summary: diabetes support/dmr  Phone Note Outgoing Call   Summary of Call: met with patient today about self monitoring his blood sugars: called [facility]:never signed forms and was disenrolled 11/2009 no lantus was ever ordered, they have no idea  if he brings in financcial information they need they can give him a sample of lantus- taxes, employment, disabliity, etc..  refill history of insulin:  Jun 12, 2009 [facility] got rx for insulin and he got 1 vial novolog- no lantus 1 vial novolog in dec 2010, jan-, feb and march  Follow-up for Phone Call        TRied to contact patient about insulin and diabetes testing supplies. spoke with patient's mother: patient cannot be reached at her number (the one we have listed for him). She says he has a cell phone, but does not have any minutes. I left a message with her for him to call me at his convenience.  Follow-up by: Jamison Neighbor RD,CDE,  February 17, 2010 4:06 PM

## 2010-11-23 NOTE — Assessment & Plan Note (Signed)
Summary: F/U/EST/VS   Vital Signs:  Patient profile:   30 year old male Height:      73 inches (185.42 cm) Weight:      298.05 pounds (135.48 kg) BMI:     39.47 Temp:     99 degrees F (37.22 degrees C) oral Pulse rate:   97 / minute BP sitting:   122 / 81  (left arm)  Vitals Entered By: Angelina Ok RN (February 15, 2010 2:21 PM) Is Patient Diabetic? Yes Did you bring your meter with you today? No Pain Assessment Patient in pain? no      Nutritional Status BMI of > 30 = obese CBG Result 345  Have you ever been in a relationship where you felt threatened, hurt or afraid?No   Does patient need assistance? Functional Status Self care Ambulation Normal Comments Needs refill on Lantus.  Check up   Primary Care Provider:  Zara Council MD   History of Present Illness: Connor Cook is a 30 year old Male with PMH/problems as outlined in the EMR, who presents to the [facility] for follow up on his high BP and DM. He is taking his BP meds as prescribed and doing lantus once a day and novolog with meals twice day. No new complaints today. Not checking his blood sugar.   Depression History:      The patient denies a depressed mood most of the day and a diminished interest in his usual daily activities.         Preventive Screening-Counseling & Management  Alcohol-Tobacco     Alcohol type: AT TIMES  /BEER     Smoking Status: quit     Packs/Day: 1PER DAY     Year Started: AT THE AGE OF 12     Year Quit: 2008  Allergies: No Known Drug Allergies   Impression & Recommendations:  Problem # 1:  DIABETES MELLITUS, TYPE I (ICD-250.01) Ms. Victory Dakin kindly gave instructions to the patient about checking his CBG. I have made refills on his trutrack meter. I will review his CBG numbers on next visit and make changes.   Addendum: Ms. Victory Dakin recommended adding sliding insulin to patient's snacks. Patient has been instructed to use 2 units insulin to every handful of chips that he takes.    His updated medication list for this problem includes:    Lantus 100 Unit/ml Soln (Insulin glargine) ..... Inject 50 units subcutaneously every night at bedtime for diabetes.    Novolog 100 Unit/ml Soln (Insulin aspart) .Marland KitchenMarland KitchenMarland KitchenMarland Kitchen 10 units before breakfast and dinner, subcutaneously; and add 2 units insulin for every handful of chips consumed.    Lisinopril 10 Mg Tabs (Lisinopril) .Marland Kitchen... Take 1 tablet by mouth once a day  Problem # 2:  HYPERTENSION (ICD-401.9) Improved and about baseline. Continue current regimen.  His updated medication list for this problem includes:    Lisinopril 10 Mg Tabs (Lisinopril) .Marland Kitchen... Take 1 tablet by mouth once a day  Problem # 3:  DYSLIPIDEMIA (ICD-272.4) Continue with pravachol.   Chol: 217 (08/13/2008 8:46:00 PM)HDL:  35 (08/13/2008 8:46:00 PM)LDL:  132 (08/13/2008 8:46:00 PM)Tri:   AST:  13 (01/11/2010 8:48:00 PM) ALT:  19 (01/11/2010 8:48:00 PM)T. Bili:  0.5 (01/11/2010 8:48:00 PM) AP:  53 (01/11/2010 8:48:00 PM)  Will need FLP repeat.  His updated medication list for this problem includes:    Pravachol 20 Mg Tabs (Pravastatin sodium) .Marland Kitchen... Take 1 tab by mouth at bedtime  Complete Medication List: 1)  Lantus 100  Unit/ml Soln (Insulin glargine) .... Inject 50 units subcutaneously every night at bedtime for diabetes. 2)  Fluphenazine Hcl 2.5 Mg/77ml Elix (Fluphenazine hcl) .... Take 0.59ml at intervals directed by [facility]. 3)  Novolog 100 Unit/ml Soln (Insulin aspart) .Marland Kitchen.. 10 units before breakfast and dinner, subcutaneously; and add 2 units insulin for every handful of chips consumed. 4)  Lisinopril 10 Mg Tabs (Lisinopril) .... Take 1 tablet by mouth once a day 5)  Pravachol 20 Mg Tabs (Pravastatin sodium) .... Take 1 tab by mouth at bedtime 6)  Truetrack Test Strp (Glucose blood) .... Please use it to check blood sugar 2-3 times a day (early morning and before and after meals)  Other Orders: Capillary Blood Glucose/CBG ([id number])  Patient  Instructions: 1)  Please schedule a follow-up appointment in 1 month. 2)   Please bring in your glucose meter with you when you come.  Prescriptions: LANTUS 100 UNIT/ML  SOLN (INSULIN GLARGINE) Inject 50 units subcutaneously every night at bedtime for diabetes.  #2 vials x 11   Entered and Authorized by:   Zara Council MD   Signed by:   Zara Council MD on 02/15/2010   Method used:   Print then Give to Patient   RxID:   [id number] TRUETRACK TEST  STRP (GLUCOSE BLOOD) Please use it to check blood sugar 2-3 times a day (early morning and before and after meals)  #1 box x 11   Entered and Authorized by:   Zara Council MD   Signed by:   Zara Council MD on 02/15/2010   Method used:   Print then Give to Patient   RxID:   [id number]   Prevention & Chronic Care Immunizations   Influenza vaccine: Fluvax 3+  (07/21/2008)    Tetanus booster: Not documented    Pneumococcal vaccine: Not documented  Other Screening   Smoking status: quit  (02/15/2010)  Diabetes Mellitus   HgbA1C: 9.5  (12/11/2009)   Hemoglobin A1C due: 03/11/2008    Eye exam: No diabetic retinopathy.     (01/13/2010)   Eye exam due: 01/2011    Foot exam: Not documented   High risk foot: No  (12/11/2009)   Foot care education: Done  (11/21/2007)   Foot exam due: 12/10/2010    Urine microalbumin/creatinine ratio: 420.8  (05/28/2009)   Urine microalbumin action/deferral: Ordered   Urine microalbumin/cr due: 11/13/2008    Diabetes flowsheet reviewed?: Yes   Progress toward A1C goal: Unchanged  Lipids   Total Cholesterol: 217  (08/13/2008)   LDL: 132  (08/13/2008)   LDL Direct: Not documented   HDL: 35  (08/13/2008)   Triglycerides: 251  (08/13/2008)    SGOT (AST): 13  (01/11/2010)   SGPT (ALT): 19  (01/11/2010)   Alkaline phosphatase: 53  (01/11/2010)   Total bilirubin: 0.5  (01/11/2010)    Lipid flowsheet reviewed?: Yes   Progress toward LDL goal: Unchanged  Hypertension   Last Blood  Pressure: 122 / 81  (02/15/2010)   Serum creatinine: 0.94  (01/11/2010)   Serum potassium 4.0  (01/11/2010)    Hypertension flowsheet reviewed?: Yes   Progress toward BP goal: Improved  Self-Management Support :   Personal Goals (by the next clinic visit) :     Personal A1C goal: 6  (01/11/2010)     Personal blood pressure goal: 130/80  (01/11/2010)     Personal LDL goal: 100  (01/11/2010)    Patient will work on the following items until the next clinic visit  to reach self-care goals:     Medications and monitoring: take my medicines every day, check my blood sugar, bring all of my medications to every visit, examine my feet every day  (02/15/2010)     Eating: drink diet soda or water instead of juice or soda, eat more vegetables, eat foods that are low in salt, eat baked foods instead of fried foods, eat fruit for snacks and desserts, limit or avoid alcohol  (02/15/2010)     Activity: take a 30 minute walk every day  (02/15/2010)    Diabetes self-management support: CBG self-monitoring log, Education handout, Resources for patients handout, Written self-care plan  (02/15/2010)   Diabetes care plan printed   Diabetes education handout printed   Last diabetes self-management training by diabetes educator: 06/18/2009   Last medical nutrition therapy: 12/19/2007    Hypertension self-management support: CBG self-monitoring log, Education handout, Resources for patients handout, Written self-care plan  (02/15/2010)   Hypertension self-care plan printed.   Hypertension education handout printed    Lipid self-management support: CBG self-monitoring log, Education handout, Resources for patients handout, Written self-care plan  (02/15/2010)   Lipid self-care plan printed.   Lipid education handout printed      Resource handout printed.     Vital Signs:  Patient profile:   30 year old male Height:      73 inches (185.42 cm) Weight:      298.05 pounds (135.48 kg) BMI:      39.47 Temp:     99 degrees F (37.22 degrees C) oral Pulse rate:   97 / minute BP sitting:   122 / 81  (left arm)  Vitals Entered By: Angelina Ok RN (February 15, 2010 2:21 PM)

## 2010-11-23 NOTE — Assessment & Plan Note (Signed)
Summary: F/U/EST/VS   Vital Signs:  Patient profile:   30 year old male Height:      73 inches Weight:      300.5 pounds BMI:     39.79 Temp:     98.0 degrees F oral Pulse rate:   78 / minute BP sitting:   125 / 85  (right arm)  Vitals Entered By: Theotis Barrio NT II (January 11, 2010 3:16 PM) CC: HERE FOR ONE MONTH FOLLOW UP  /  DM EYE APPT IS MARCH 23.011 WITH [facility] Is Patient Diabetic? Yes Did you bring your meter with you today? No Pain Assessment Patient in pain? no      Nutritional Status BMI of > 30 = obese  Have you ever been in a relationship where you felt threatened, hurt or afraid?No   Does patient need assistance? Functional Status Self care Ambulation Normal Comments HERE FOR ONE MONTH FOLLOW UP APPT /  DM EYE APPT IS MARCH 23, 011 AT [facility].   Primary Care Provider:  Zara Council MD  CC:  HERE FOR ONE MONTH FOLLOW UP  /  DM EYE APPT IS MARCH 23.011 WITH [facility].  History of Present Illness: Mr. Pitstick comes back to the clinic for follow up on his diabetes. Overall doing okay, but he seems to have gained some weight. Using lantus 50 once a day. CBG's mostly about 200. No new complaints.  Did not bring his meter with him today.   Preventive Screening-Counseling & Management  Alcohol-Tobacco     Alcohol type: AT TIMES  /BEER     Smoking Status: quit     Packs/Day: 1PER DAY     Year Started: AT THE AGE OF 12     Year Quit: 2008  Caffeine-Diet-Exercise     Does Patient Exercise: yes     Type of exercise: WALKING     Times/week: 1-2  Current Medications (verified): 1)  Lantus 100 Unit/ml  Soln (Insulin Glargine) .... Inject 50 Units Subcutaneously Every Night At Bedtime For Diabetes. 2)  Fluphenazine Hcl 2.5 Mg/77ml  Elix (Fluphenazine Hcl) .... Take 0.30ml At Intervals Directed By [facility]. 3)  Novolog 100 Unit/ml  Soln (Insulin Aspart) .Marland Kitchen.. 10 Units Before Breakfast and Dinner, Subcutaneously. 4)  Lisinopril  10 Mg Tabs (Lisinopril) .... Take 1 Tablet By Mouth Once A Day 5)  Pravachol 20 Mg Tabs (Pravastatin Sodium) .... Take 1 Tab By Mouth At Bedtime  Allergies (verified): No Known Drug Allergies  Past History:  Past Medical History: Last updated: 10/13/2008 Diabetes mellitus, type I (dx Dec 2008) w/ episode of DKA H/o acute pancreatitis H/o upper GI bleed H/o aspiration pneumonia H/o acute renal failure Psychosis (receives fluphenazine injections every 2 weeks at [facility]; contact is Merryl Hacker, labeled as Psychotic Disorder NOS).  Family History: Last updated: 11/14/2007 Mother: DM, HTN Father: deceased (age 1) of MI Siblings: 1 brother, 2 sisters Children: none  Social History: Last updated: 08/13/2008 Lives with mom in [place].  Quit few months (1ppd x 2-3y).  Drank 2-3 drinks on weekend.  No drugs.  Has a girlfriend.  Risk Factors: Exercise: yes (01/11/2010)  Risk Factors: Smoking Status: quit (01/11/2010) Packs/Day: 1PER DAY (01/11/2010)  Review of Systems       as per HPI  Physical Exam  General:  alert and overweight-appearing.   Mouth:  pharynx pink and moist.   Lungs:  normal respiratory effort and normal breath sounds.  Heart:  normal rate and regular rhythm.   Abdomen:  soft and non-tender.   Pulses:  normal peripheral pulses  Neurologic:  non focal Psych:  Oriented X3 and memory intact for recent and remote.  Very flat affect.   Impression & Recommendations:  Problem # 1:  DIABETES MELLITUS, TYPE I (ICD-250.01) DATA REVIEWED A1c: 9.5 (12/11/2009 3:04:05 PM)  MICROALB/CR: 420.8 (05/28/2009 1:10:00 AM) LDL: 132 (08/13/2008 8:46:00 PM) EYE: new referral today FOOT: up todate WEIGHT: 39.79 (01/11/2010 3:01:56 PM)   Random CBG 149. I have asked him to bring his glucose meter next time, so that we can make adjustments to his insulin dose.  Orders: T-Comprehensive Metabolic Panel ([id number])  Problem # 2:  HYPERTENSION  (ICD-401.9)  Diastolic borderline but improved from last time. No changes today.    His updated medication list for this problem includes:    Lisinopril 10 Mg Tabs (Lisinopril) .Marland Kitchen... Take 1 tablet by mouth once a day  Orders: T-Comprehensive Metabolic Panel ([id number])  Problem # 3:  DYSLIPIDEMIA (ICD-272.4)  Chol: 217 (08/13/2008 8:46:00 PM)HDL:  35 (08/13/2008 8:46:00 PM)LDL:  132 (08/13/2008 8:46:00 PM)Tri:   AST:  25 (08/13/2008 8:46:00 PM) ALT:  36 (08/13/2008 8:46:00 PM)T. Bili:  0.4 (08/13/2008 8:46:00 PM) AP:  56 (08/13/2008 8:46:00 PM)  CONTINUE PRAVACHOL.  His updated medication list for this problem includes:    Pravachol 20 Mg Tabs (Pravastatin sodium) .Marland Kitchen... Take 1 tab by mouth at bedtime  Orders: T-Comprehensive Metabolic Panel ([id number])  Complete Medication List: 1)  Lantus 100 Unit/ml Soln (Insulin glargine) .... Inject 50 units subcutaneously every night at bedtime for diabetes. 2)  Fluphenazine Hcl 2.5 Mg/21ml Elix (Fluphenazine hcl) .... Take 0.28ml at intervals directed by [facility]. 3)  Novolog 100 Unit/ml Soln (Insulin aspart) .Marland Kitchen.. 10 units before breakfast and dinner, subcutaneously. 4)  Lisinopril 10 Mg Tabs (Lisinopril) .... Take 1 tablet by mouth once a day 5)  Pravachol 20 Mg Tabs (Pravastatin sodium) .... Take 1 tab by mouth at bedtime  Patient Instructions: 1)  Please sign up for Ms. Lupita Leash Riley's healthy life style class.  2)    3)  Please schedule a follow-up appointment in 1 month. Please bring your glucose meter with you when come.  4)  We will let you know if anything wrong with your lab work.   Process Orders Check Orders Results:     [facility]: ABN not required for this insurance Order queued for requisitioning for [facility]: January 11, 2010 3:37 PM  Tests Sent for requisitioning (January 11, 2010 3:37 PM):     01/11/2010: [facility] -- T-Comprehensive Metabolic Panel [phone]  (signed)    Prevention & Chronic Care Immunizations   Influenza vaccine: Fluvax 3+  (07/21/2008)    Tetanus booster: Not documented    Pneumococcal vaccine: Not documented  Other Screening   Smoking status: quit  (01/11/2010)  Diabetes Mellitus   HgbA1C: 9.5  (12/11/2009)   Hemoglobin A1C due: 03/11/2008    Eye exam: No diabetic retinopathy.   Visual acuity OD : 20/20 Visual acuity OS : 20/20      Intraocular pressure OD: 20 exam by Dr. Liliane Bade        (12/10/2008)   Eye exam due: 12/2009    Foot exam: Not documented   High risk foot: No  (12/11/2009)   Foot care education: Done  (11/21/2007)   Foot exam due: 12/10/2010    Urine microalbumin/creatinine ratio: 420.8  (05/28/2009)  Urine microalbumin action/deferral: Ordered   Urine microalbumin/cr due: 11/13/2008    Diabetes flowsheet reviewed?: Yes   Progress toward A1C goal: Unchanged  Lipids   Total Cholesterol: 217  (08/13/2008)   LDL: 132  (08/13/2008)   LDL Direct: Not documented   HDL: 35  (08/13/2008)   Triglycerides: 251  (08/13/2008)    SGOT (AST): 25  (08/13/2008)   SGPT (ALT): 36  (08/13/2008) CMP ordered    Alkaline phosphatase: 56  (08/13/2008)   Total bilirubin: 0.4  (08/13/2008)    Lipid flowsheet reviewed?: Yes   Progress toward LDL goal: Unchanged  Hypertension   Last Blood Pressure: 125 / 85  (01/11/2010)   Serum creatinine: 1.04  (10/13/2008)   Serum potassium 4.1  (10/13/2008) CMP ordered     Hypertension flowsheet reviewed?: Yes   Progress toward BP goal: Unchanged  Self-Management Support :   Personal Goals (by the next clinic visit) :     Personal A1C goal: 6  (01/11/2010)     Personal blood pressure goal: 130/80  (01/11/2010)     Personal LDL goal: 100  (01/11/2010)    Patient will work on the following items until the next clinic visit to reach self-care goals:     Medications and monitoring: take my medicines every day, check my blood sugar, bring all of my  medications to every visit, examine my feet every day  (01/11/2010)     Eating: drink diet soda or water instead of juice or soda, eat more vegetables, use fresh or frozen vegetables, eat foods that are low in salt, eat baked foods instead of fried foods, eat fruit for snacks and desserts, limit or avoid alcohol  (01/11/2010)     Activity: take a 30 minute walk every day  (01/11/2010)    Diabetes self-management support: Resources for patients handout  (01/11/2010)   Last diabetes self-management training by diabetes educator: 06/18/2009   Last medical nutrition therapy: 12/19/2007    Hypertension self-management support: Resources for patients handout  (01/11/2010)    Lipid self-management support: Resources for patients handout  (01/11/2010)        Resource handout printed.

## 2010-11-23 NOTE — Progress Notes (Signed)
Summary: Soc. Work  Nurse, children's placed by: Dorothe Pea, LCSW Call placed to: Mental Health Summary of Call: Called area mental health Marnette Burgess nurse) who advised me that they have tried to connect [place] to various resources in the community including [employer] and community support case mgmt but neither he nor his mother have been interested in following through.    We should get another release from [facility] for updated [facility] records as well as talk to the family directly about community programs.  I will meet with Earl Lites at next clinic appmt or at the same time Lupita Leash sees [place] again.

## 2010-11-23 NOTE — Progress Notes (Signed)
Summary: needs appointment with PCP/dmr  Phone Note Outgoing Call   Call placed by: Jamison Neighbor RD,CDE,  April 14, 2010 9:32 AM Summary of Call: Called patient to because he is due for an appointment with his doctor and has a high A1C. His mother said to call him on his cell phone [phone] and to call her back if this number doesn't work.   Follow-up for Phone Call        Tried calling patient, a male answered and then phone was disconnected. Called back and left a message for Connor Cook to call us at either [phone] or [phone] to reschedule an appoitnment Follow-up by: Jamison Neighbor RD,CDE,  April 14, 2010 10:06 AM  Additional Follow-up for Phone Call Additional follow up Details #1::        Contacted patient this morning and he has made an appointment for next month with his PCP Dr. Eben Burow. Pt confirms he will be here for appointment on 05/07/2010 at 4:00p.m. Additional Follow-up by: Shon Hough,  April 15, 2010 9:19 AM    Additional Follow-up for Phone Call Additional follow up Details #2::    thank you! Follow-up by: Jamison Neighbor RD,CDE,  April 15, 2010 10:45 AM

## 2010-12-21 ENCOUNTER — Encounter: Payer: Self-pay | Admitting: Internal Medicine

## 2011-01-11 LAB — GLUCOSE, CAPILLARY

## 2011-01-12 LAB — GLUCOSE, CAPILLARY: Glucose-Capillary: 271 mg/dL — ABNORMAL HIGH (ref 70–99)

## 2011-01-16 LAB — GLUCOSE, CAPILLARY: Glucose-Capillary: 149 mg/dL — ABNORMAL HIGH (ref 70–99)

## 2011-01-29 LAB — GLUCOSE, CAPILLARY: Glucose-Capillary: 288 mg/dL — ABNORMAL HIGH (ref 70–99)

## 2011-03-08 NOTE — Consult Note (Signed)
Connor Cook, Connor Cook            ACCOUNT NO.:  [id number]   MEDICAL RECORD NO.:  [id number]          PATIENT TYPE:  INP   LOCATION:  2112                         FACILITY:  [facility]   PHYSICIAN:  Fayrene Fearing L. Deterding, M.D.DATE OF BIRTH:  07-Dec-1980   DATE OF CONSULTATION:  10/21/2007  DATE OF DISCHARGE:                                 CONSULTATION   REASON FOR CONSULTATION:  Acute renal failure.   HISTORY OF PRESENT ILLNESS:  Connor Cook is a 30 year old gentleman  with past medical history significant for psychosis, and he has been on  bimonthly Depo-Prolixin injections.  He was found at home on the floor.  Apparently recent history of polydipsia and polyuria.  He was  hypotensive in the emergency room __________ now it is down to 13.  Glucose has been brought down to about 100-150 an hour.  He has been  here about eight hours and has gotten about 4.5 to 5 L of fluid.  __________  .  Creatinine value on the computer is 1.3 in June of this  year.  Glucose was normal at that time also.   PAST MEDICAL HISTORY:  Not obtainable at this time.   REVIEW OF SYSTEMS:  Not obtainable.   OBJECTIVE/PHYSICAL EXAMINATION:  VITAL SIGNS:  Blood pressure 76/41,  heart rate 130.  HEENT:  Benign.  NECK:  No masses, thyromegaly.  CARDIOVASCULAR:  Regular rhythm, grade 2/6 systolic ejection murmur  heard best at left sternal border.  He has no significant __________  .  No bruits noted.  ABDOMEN:  Very few bowel sounds, soft, mild distention and tender in the  upper epigastrium.  LUNGS:  Scattered rhonchi, but no rales.  SKIN:  Without lesions.  He is very diaphoretic.  NEUROLOGICAL:  He is disoriented.  However, he responds to name.  Does  respond to commands.  He moves all extremities symmetrically.  His face  is symmetric.  His gaze will follow __________  .  Pupil reaction  normal.   LABORATORY DATA:  CK 316, left base __________ 38.  Hemoglobin 20.2,  white count 14.2, platelets 296,000.   Urinalysis greater than  __________, glucose, large blood, ketones 15 mg %, 100 mg % protein,  __________ acute kidney injury, most likely ATN __________ conclusion.   Cannot rule out toxin or sepsis contributing at this time to volume  depletion.  Hemodynamic __________ seem to be the biggest issue.  He is  at least 15 to 20% volume depleted at this time and needs __________ 11  to 13 liters.  Will push his volume, but watch his potassium.  __________ would not add potassium __________ through tissue necrosis.  __________ is going to be a major __________ and I think he may develop  __________ syndrome.  At this time I will try to push his volume, push  his __________ .  1. Increase WBC.  Will culture and put on antibiotics.  2. Pancreatitis.  Will need __________ without contrast.  3. Blood pressure.  We will give __________ stimulant.  4. __________ drip.  Will slowly decrease that.  5. Psychosis.   PLAN:  1. IV normal saline 2 L over the next hour, then 500 mL an hour.  2. Norepinephrine.  3. Cultures.  4. Antibiotics.  5. __________ abdomen and chest.           ______________________________  Llana Aliment. Deterding, M.D.     JLD/MEDQ  D:  10/21/2007  T:  10/22/2007  Job:  [id number]   cc:   Manning Charity, MD

## 2011-03-08 NOTE — Discharge Summary (Signed)
Connor Cook, Connor Cook            ACCOUNT NO.:  [id number]   MEDICAL RECORD NO.:  [id number]          PATIENT TYPE:  INP   LOCATION:  5158                         FACILITY:  [facility]   PHYSICIAN:  Zara Council, MD      DATE OF BIRTH:  03-08-1981   DATE OF ADMISSION:  10/21/2007  DATE OF DISCHARGE:  11/07/2007                               DISCHARGE SUMMARY   DISCHARGE DIAGNOSES:  1. Newly diagnosed type 1 diabetes mellitus.  2. Diabetic ketoacidosis/hyperosmolar hyperglycemic syndrome.  3. Acute pancreatitis complicated by systemic inflammatory response      syndrome.  4. Upper gastrointestinal bleeding with aspiration.  5. Acute renal failure.  6. Ventilator-dependent respiratory failure.  7. Rhabdomyolysis.  8. Metabolic acidosis related to diabetic ketoacidosis, renal failure,      and systemic inflammatory response syndrome.  9. Hypokalemia.  10.Hyponatremia.  11.Altered mental status likely ICU delirium.  12.Sinus tachycardia, resolved with hydration.  13.Protein-calorie malnutrition.  14.h/o psychiatric disorder on long-term Prolixin   DISCHARGE MEDICATIONS:  1. Lantus subcutaneous 45 units in the morning and 47 units in the      evening.  2. Omeprazole 40 mg once a day for 4 weeks.  3. Prolixin 0.5 mL as per [facility].   DISPOSITION AND FOLLOWUP:  The patient has been given the followup  appointments:  1. Followup with Dr. Maryland Pink at [facility] on      November 14, 2007, at 11:15 in the morning.  A CBC is recommended at      the followup visit to check into the resolution of the patient's      leukocytosis.  Also a BMET is recommended to make sure that the      patient's acute renal failure has completely resolved and      creatinine has come down to within normal limits.  2. On the same day at 09:30 in the morning, the patient is to be seen      by Ms. Victory Dakin, who is to review the patient as regards to his      problem of  diabetes mellitus type 1.  The primary care physician is      also requested to ask the patient to contact Ms. Reola Mosher for      help with his medications.  3. The patient is also requested to make an appointment with his      psychiatrist at [facility] for his psychiatric      problem.   PROCEDURES:  1. Ultrasound of abdomen on October 22, 2007.  Impression:      a.     Diffusely enlarged pancreas consistent with acute       pancreatitis and mild ascites.      b.     No evidence of gallstones or biliary ductal dilatation.      c.     Diffuse fatty infiltration of the liver.  2. CT of abdomen without contrast on October 22, 2007.  Impression:      Findings of acute pancreatitis.  No evidence of acute hemorrhoids.  Extrapancreatic effusions and ascites.  Diffuse liver disease,      fatty infiltration, and also consider hepatitis.  3. CT of pelvis without contrast.  Impression:  Pelvic ascites.  Large      amount of stool in the rectosigmoid.  Pelvic sidewalls defined.  4. CT of abdomen without contrast on October 30, 2007.  Impression:      a.     Moderate pancreatitis without acute complication.  Please       note that the exam was limited for evaluation of the pancreas.      b.     Focal ascending colitis favoring infection.      c.     Trace ascites.  5. CT of pelvis without contrast on October 30, 2007.  Impression:  No      acute pelvic process.  6. CT of chest without contrast on October 30, 2007.  Impression:      Bibasilar airspace disease consistent with infection, less likely      atelectasis.  7. Upper gastrointestinal endoscopy on October 26, 2007.  Diagnosis:      Esophagitis and duodenitis.  8. Flexible sigmoidoscopy on October 26, 2007.  Diagnosis:  Retain      blood in the left colon.  No active bleeding.  Likely old blood      from previous bleed.   CONSULTATIONS:  1. Renal consult, James L. Deterding, M.D.  2. Hedwig Morton. Juanda Chance, MD,  Gastroenterology.   ADMISSION HISTORY:  Connor Cook is a 30 year old African American man  without any significant past medical history other than an unspecified  psychiatric condition for which he was on depot fluphenazine.  He  presented on October 21, 2007, in a state of altered mental status.  He  was found on the floor by his mother in a very confused state.  This had  been preceded by several days history of polyuria, polydipsia, and back  pain.  His CBG was noted to be more than 700 by the EMS.   ADMISSION PHYSICAL:  VITAL SIGNS:  Temperature 97.4, blood pressure  66/51, pulse 130, and respiratory rate 30.  Oxygen sat 97% on 2 L.  GENERAL:  Lethargic, oriented only to person.  EYES:  PERRLA.  ENT:  Dry mouth.  Lips chapped.  NECK:  Supple.  No lymphadenopathy.  RESPIRATORY:  Tachypneic.  Clear to auscultation bilaterally,  anteriorly.  CARDIOVASCULAR:  Tachycardiac.  Regular rhythm.  No murmur.  GASTROINTESTINAL:  Positive bowel sounds.  Soft, tender to percussion in  the epigastrium.  No rebound or guarding.  EXTREMITIES:  No edema.  SKIN:  No rashes.  LYMPH:  No cervical lymphadenopathy.  NEUROLOGIC:  Moving all extremities.  Babinski is downgoing bilaterally.  PSYCHIATRIC:  No auditory or visual hallucinations.  No suicidal  ideations.   ADMISSION LABS:  Hemoglobin 17.4, MCV 18.7, white cell 14.2, ANC 12.4,  and platelet 290,000.  Sodium 125, potassium 4, chloride 96, bicarbonate  13, BUN 62, creatinine 4.1, blood glucose more than 700, anion gap 16,  lipase 4638, cortisol 49, LDH 232, and CK 7853.   HOSPITAL COURSE:  1. Newly diagnosed DM presenting with DKA/HHS.  The patient was      admitted and started on IV fluids.  He was also placed on IV      insulin and potassium supplement.  It was not clear whether his      diabetes is a cause or a result of the pancreatitis.  He  was      closely monitored with q.1 h. CBGs and q.2 h. BMET and following      this, his gap  started to close.  He was later transferred to      subcutaneous Lantus.  2. Pancreatitis:  The patient presented with a very high lipase, and      his abdominal imaging was suggestive of pancreatitis.  The patient      was given supportive management with IV fluids and close monitoring      in the ICU.  His pancreatitis was complicated by SIRS, and he was      also having a very low blood pressure issues as well as renal      failure, which was also supportive and managed mainly with IV      fluids.  3. Rhabdomyolysis:  This was likely secondary to dehydration.  He was      given IV fluids as well as bicarbonate, and we closely monitored      his CK and renal function.  4. Acute renal failure as mentioned above was most likely related to      pancreatitis, SIRS, and hypokalemia.  We monitored him closely and      gave him IV fluids and maintained strict in's and out's records.  A      renal consultation was requested, but no hemodialysis was      recommended.  5. Acidosis:  This was likely multifactorial secondary to acute renal      failure, SIRS, and DKA.  He was managed for DKA and renal failure      as mentioned above and was also given bicarbonate.  6. GI bleed:  The patient had gastrointestinal bleed while in the unit      and was also witnessed to have aspiration.  Following this, the      patient was electively intubated for airway protection.  7. Fever:  The patient was found to have high temperature while in the      unit and was placed on vancomycin and Zosyn.  His scan of the chest      did reveal some infection.  Also, the abdominal scan showed      possible infection.  However, his blood cultures and urinary      culture were consistently negative.  He was later shifted to      Primaxin.  Once the white cell count started to trend down, he was      brought off antibiotics.  8. Electrolyte disturbances:  These were treated with IV fluids for      hyponatremia with  potassium replacement for hypokalemia.   DISCHARGE DAY VITALS:  Temperature 97 degree F, HR - 101, RR - 20, BP  111/79, sats 95% on room air.   DISCHARGE DAY LABS:  Sodium 134, Potassium 4.4, Chloride 104,  Bicarbonate 21, Glucose 277, BUN 23, Creatinine 2.05, Calcium 9.2      Zara Council, MD  Electronically Signed     AS/MEDQ  D:  11/08/2007  T:  11/08/2007  Job:  [id number]

## 2011-07-14 LAB — RENAL FUNCTION PANEL
Albumin: 1.7 — ABNORMAL LOW
BUN: 100 — ABNORMAL HIGH
BUN: 33 — ABNORMAL HIGH
BUN: 95 — ABNORMAL HIGH
BUN: 97 — ABNORMAL HIGH
CO2: 18 — ABNORMAL LOW
CO2: 18 — ABNORMAL LOW
CO2: 19
CO2: 21
Calcium: 7.2 — ABNORMAL LOW
Calcium: 7.5 — ABNORMAL LOW
Calcium: 7.5 — ABNORMAL LOW
Calcium: 8.3 — ABNORMAL LOW
Chloride: 113 — ABNORMAL HIGH
Chloride: 121 — ABNORMAL HIGH
Chloride: 122 — ABNORMAL HIGH
Creatinine, Ser: 2.64 — ABNORMAL HIGH
Creatinine, Ser: 8.62 — ABNORMAL HIGH
Creatinine, Ser: 8.85 — ABNORMAL HIGH
Creatinine, Ser: 9.19 — ABNORMAL HIGH
Creatinine, Ser: 9.29 — ABNORMAL HIGH
Glucose, Bld: 157 — ABNORMAL HIGH
Glucose, Bld: 170 — ABNORMAL HIGH
Glucose, Bld: 176 — ABNORMAL HIGH
Glucose, Bld: 182 — ABNORMAL HIGH
Phosphorus: 4.7 — ABNORMAL HIGH
Phosphorus: 5.2 — ABNORMAL HIGH
Phosphorus: 5.3 — ABNORMAL HIGH
Phosphorus: 5.3 — ABNORMAL HIGH
Potassium: 3.1 — ABNORMAL LOW
Potassium: 3.2 — ABNORMAL LOW
Sodium: 145
Sodium: 150 — ABNORMAL HIGH

## 2011-07-14 LAB — CBC
HCT: 25.5 — ABNORMAL LOW
HCT: 26.7 — ABNORMAL LOW
HCT: 27.3 — ABNORMAL LOW
HCT: 27.8 — ABNORMAL LOW
HCT: 28.2 — ABNORMAL LOW
HCT: 29 — ABNORMAL LOW
HCT: 29 — ABNORMAL LOW
HCT: 30.2 — ABNORMAL LOW
HCT: 30.8 — ABNORMAL LOW
HCT: 30.8 — ABNORMAL LOW
HCT: 30.9 — ABNORMAL LOW
HCT: 30.9 — ABNORMAL LOW
HCT: 31.1 — ABNORMAL LOW
HCT: 31.4 — ABNORMAL LOW
Hemoglobin: 10.1 — ABNORMAL LOW
Hemoglobin: 10.4 — ABNORMAL LOW
Hemoglobin: 10.4 — ABNORMAL LOW
Hemoglobin: 10.4 — ABNORMAL LOW
Hemoglobin: 10.4 — ABNORMAL LOW
Hemoglobin: 10.4 — ABNORMAL LOW
Hemoglobin: 10.5 — ABNORMAL LOW
Hemoglobin: 10.6 — ABNORMAL LOW
Hemoglobin: 10.6 — ABNORMAL LOW
Hemoglobin: 8.7 — ABNORMAL LOW
Hemoglobin: 9.1 — ABNORMAL LOW
Hemoglobin: 9.2 — ABNORMAL LOW
Hemoglobin: 9.3 — ABNORMAL LOW
Hemoglobin: 9.9 — ABNORMAL LOW
MCHC: 33.3
MCHC: 33.5
MCHC: 33.6
MCHC: 33.7
MCHC: 33.9
MCHC: 34
MCHC: 34
MCHC: 34
MCHC: 34.1
MCHC: 34.1
MCHC: 34.2
MCHC: 34.3
MCHC: 34.5
MCHC: 34.6
MCHC: 34.8
MCHC: 35.3
MCV: 79
MCV: 79
MCV: 79.3
MCV: 79.5
MCV: 79.9
MCV: 80
MCV: 80.2
MCV: 80.8
MCV: 81
Platelets: 116 — ABNORMAL LOW
Platelets: 133 — ABNORMAL LOW
Platelets: 149 — ABNORMAL LOW
Platelets: 172
Platelets: 239
Platelets: 278
Platelets: 285
Platelets: 352
Platelets: 379
Platelets: 393
Platelets: 85 — ABNORMAL LOW
RBC: 3.42 — ABNORMAL LOW
RBC: 3.59 — ABNORMAL LOW
RBC: 3.67 — ABNORMAL LOW
RBC: 3.72 — ABNORMAL LOW
RBC: 3.72 — ABNORMAL LOW
RBC: 3.82 — ABNORMAL LOW
RBC: 3.86 — ABNORMAL LOW
RBC: 3.89 — ABNORMAL LOW
RBC: 3.91 — ABNORMAL LOW
RBC: 3.97 — ABNORMAL LOW
RDW: 14.9
RDW: 15.1
RDW: 15.1
RDW: 15.1
RDW: 15.2
RDW: 15.3
RDW: 15.3
RDW: 15.3
RDW: 15.3
RDW: 15.4
RDW: 15.6 — ABNORMAL HIGH
RDW: 15.6 — ABNORMAL HIGH
RDW: 15.8 — ABNORMAL HIGH
RDW: 15.8 — ABNORMAL HIGH
RDW: 15.9 — ABNORMAL HIGH
WBC: 10.5
WBC: 11.8 — ABNORMAL HIGH
WBC: 12.8 — ABNORMAL HIGH
WBC: 13.4 — ABNORMAL HIGH
WBC: 15 — ABNORMAL HIGH
WBC: 17.6 — ABNORMAL HIGH
WBC: 9.6

## 2011-07-14 LAB — BASIC METABOLIC PANEL
BUN: 102 — ABNORMAL HIGH
BUN: 103 — ABNORMAL HIGH
BUN: 103 — ABNORMAL HIGH
BUN: 108 — ABNORMAL HIGH
BUN: 108 — ABNORMAL HIGH
BUN: 109 — ABNORMAL HIGH
BUN: 110 — ABNORMAL HIGH
BUN: 113 — ABNORMAL HIGH
BUN: 118 — ABNORMAL HIGH
BUN: 21
BUN: 23
CO2: 18 — ABNORMAL LOW
CO2: 18 — ABNORMAL LOW
CO2: 18 — ABNORMAL LOW
CO2: 19
CO2: 19
CO2: 19
CO2: 19
CO2: 20
CO2: 20
CO2: 20
CO2: 20
CO2: 21
CO2: 21
CO2: 21
CO2: 21
CO2: 21
CO2: 21
CO2: 22
CO2: 22
CO2: 22
CO2: 22
CO2: 24
CO2: 24
Calcium: 7.9 — ABNORMAL LOW
Calcium: 8 — ABNORMAL LOW
Calcium: 8.3 — ABNORMAL LOW
Calcium: 8.3 — ABNORMAL LOW
Calcium: 8.3 — ABNORMAL LOW
Calcium: 8.4
Calcium: 8.4
Calcium: 8.4
Calcium: 8.4
Calcium: 8.5
Calcium: 8.5
Calcium: 8.5
Calcium: 8.6
Calcium: 8.6
Calcium: 8.7
Calcium: 8.7
Calcium: 8.9
Calcium: 9.1
Calcium: 9.2
Calcium: 9.3
Chloride: 109
Chloride: 109
Chloride: 110
Chloride: 113 — ABNORMAL HIGH
Chloride: 117 — ABNORMAL HIGH
Chloride: 118 — ABNORMAL HIGH
Chloride: 120 — ABNORMAL HIGH
Chloride: 120 — ABNORMAL HIGH
Chloride: 121 — ABNORMAL HIGH
Chloride: 121 — ABNORMAL HIGH
Chloride: 121 — ABNORMAL HIGH
Chloride: 122 — ABNORMAL HIGH
Chloride: 122 — ABNORMAL HIGH
Chloride: 124 — ABNORMAL HIGH
Chloride: 124 — ABNORMAL HIGH
Chloride: 128 — ABNORMAL HIGH
Creatinine, Ser: 10.09 — ABNORMAL HIGH
Creatinine, Ser: 10.13 — ABNORMAL HIGH
Creatinine, Ser: 10.15 — ABNORMAL HIGH
Creatinine, Ser: 10.25 — ABNORMAL HIGH
Creatinine, Ser: 2.04 — ABNORMAL HIGH
Creatinine, Ser: 2.21 — ABNORMAL HIGH
Creatinine, Ser: 4.33 — ABNORMAL HIGH
Creatinine, Ser: 5.83 — ABNORMAL HIGH
Creatinine, Ser: 6.52 — ABNORMAL HIGH
Creatinine, Ser: 8.22 — ABNORMAL HIGH
Creatinine, Ser: 9.11 — ABNORMAL HIGH
Creatinine, Ser: 9.47 — ABNORMAL HIGH
Creatinine, Ser: 9.57 — ABNORMAL HIGH
Creatinine, Ser: 9.88 — ABNORMAL HIGH
Creatinine, Ser: 9.89 — ABNORMAL HIGH
Creatinine, Ser: 9.92 — ABNORMAL HIGH
GFR calc Af Amer: 12 — ABNORMAL LOW
GFR calc Af Amer: 20 — ABNORMAL LOW
GFR calc Af Amer: 36 — ABNORMAL LOW
GFR calc Af Amer: 42 — ABNORMAL LOW
GFR calc Af Amer: 43 — ABNORMAL LOW
GFR calc Af Amer: 47 — ABNORMAL LOW
GFR calc Af Amer: 7 — ABNORMAL LOW
GFR calc Af Amer: 7 — ABNORMAL LOW
GFR calc Af Amer: 7 — ABNORMAL LOW
GFR calc Af Amer: 7 — ABNORMAL LOW
GFR calc Af Amer: 7 — ABNORMAL LOW
GFR calc Af Amer: 7 — ABNORMAL LOW
GFR calc Af Amer: 8 — ABNORMAL LOW
GFR calc Af Amer: 8 — ABNORMAL LOW
GFR calc Af Amer: 8 — ABNORMAL LOW
GFR calc Af Amer: 8 — ABNORMAL LOW
GFR calc Af Amer: 9 — ABNORMAL LOW
GFR calc Af Amer: 9 — ABNORMAL LOW
GFR calc non Af Amer: 12 — ABNORMAL LOW
GFR calc non Af Amer: 23 — ABNORMAL LOW
GFR calc non Af Amer: 35 — ABNORMAL LOW
GFR calc non Af Amer: 39 — ABNORMAL LOW
GFR calc non Af Amer: 6 — ABNORMAL LOW
GFR calc non Af Amer: 6 — ABNORMAL LOW
GFR calc non Af Amer: 6 — ABNORMAL LOW
GFR calc non Af Amer: 6 — ABNORMAL LOW
GFR calc non Af Amer: 6 — ABNORMAL LOW
GFR calc non Af Amer: 6 — ABNORMAL LOW
GFR calc non Af Amer: 6 — ABNORMAL LOW
GFR calc non Af Amer: 6 — ABNORMAL LOW
GFR calc non Af Amer: 6 — ABNORMAL LOW
GFR calc non Af Amer: 6 — ABNORMAL LOW
GFR calc non Af Amer: 6 — ABNORMAL LOW
GFR calc non Af Amer: 7 — ABNORMAL LOW
GFR calc non Af Amer: 7 — ABNORMAL LOW
GFR calc non Af Amer: 7 — ABNORMAL LOW
GFR calc non Af Amer: 7 — ABNORMAL LOW
GFR calc non Af Amer: 8 — ABNORMAL LOW
Glucose, Bld: 105 — ABNORMAL HIGH
Glucose, Bld: 133 — ABNORMAL HIGH
Glucose, Bld: 137 — ABNORMAL HIGH
Glucose, Bld: 138 — ABNORMAL HIGH
Glucose, Bld: 147 — ABNORMAL HIGH
Glucose, Bld: 151 — ABNORMAL HIGH
Glucose, Bld: 165 — ABNORMAL HIGH
Glucose, Bld: 166 — ABNORMAL HIGH
Glucose, Bld: 173 — ABNORMAL HIGH
Glucose, Bld: 176 — ABNORMAL HIGH
Glucose, Bld: 181 — ABNORMAL HIGH
Glucose, Bld: 184 — ABNORMAL HIGH
Glucose, Bld: 200 — ABNORMAL HIGH
Glucose, Bld: 203 — ABNORMAL HIGH
Glucose, Bld: 204 — ABNORMAL HIGH
Glucose, Bld: 205 — ABNORMAL HIGH
Glucose, Bld: 209 — ABNORMAL HIGH
Glucose, Bld: 216 — ABNORMAL HIGH
Glucose, Bld: 218 — ABNORMAL HIGH
Glucose, Bld: 277 — ABNORMAL HIGH
Glucose, Bld: 297 — ABNORMAL HIGH
Glucose, Bld: 79
Potassium: 3 — ABNORMAL LOW
Potassium: 3.1 — ABNORMAL LOW
Potassium: 3.2 — ABNORMAL LOW
Potassium: 3.2 — ABNORMAL LOW
Potassium: 3.2 — ABNORMAL LOW
Potassium: 3.2 — ABNORMAL LOW
Potassium: 3.3 — ABNORMAL LOW
Potassium: 3.3 — ABNORMAL LOW
Potassium: 3.4 — ABNORMAL LOW
Potassium: 3.4 — ABNORMAL LOW
Potassium: 3.5
Potassium: 3.6
Potassium: 3.7
Potassium: 3.7
Potassium: 3.8
Potassium: 4.1
Potassium: 4.1
Potassium: 4.2
Sodium: 136
Sodium: 137
Sodium: 139
Sodium: 141
Sodium: 147 — ABNORMAL HIGH
Sodium: 148 — ABNORMAL HIGH
Sodium: 148 — ABNORMAL HIGH
Sodium: 148 — ABNORMAL HIGH
Sodium: 149 — ABNORMAL HIGH
Sodium: 149 — ABNORMAL HIGH
Sodium: 149 — ABNORMAL HIGH
Sodium: 151 — ABNORMAL HIGH
Sodium: 152 — ABNORMAL HIGH
Sodium: 152 — ABNORMAL HIGH
Sodium: 153 — ABNORMAL HIGH
Sodium: 154 — ABNORMAL HIGH
Sodium: 154 — ABNORMAL HIGH
Sodium: 157 — ABNORMAL HIGH

## 2011-07-14 LAB — COMPREHENSIVE METABOLIC PANEL
ALT: 36
ALT: 55 — ABNORMAL HIGH
ALT: 78 — ABNORMAL HIGH
ALT: 84 — ABNORMAL HIGH
AST: 107 — ABNORMAL HIGH
AST: 37
AST: 88 — ABNORMAL HIGH
Albumin: 1.9 — ABNORMAL LOW
Albumin: 1.9 — ABNORMAL LOW
Albumin: 1.9 — ABNORMAL LOW
Albumin: 2.6 — ABNORMAL LOW
Alkaline Phosphatase: 47
Alkaline Phosphatase: 53
Alkaline Phosphatase: 91
Alkaline Phosphatase: 91
BUN: 95 — ABNORMAL HIGH
BUN: 97 — ABNORMAL HIGH
BUN: 98 — ABNORMAL HIGH
CO2: 19
CO2: 21
Calcium: 8.6
Calcium: 9
Chloride: 111
Chloride: 119 — ABNORMAL HIGH
Chloride: 124 — ABNORMAL HIGH
Chloride: 126 — ABNORMAL HIGH
Creatinine, Ser: 2.2 — ABNORMAL HIGH
Creatinine, Ser: 5.15 — ABNORMAL HIGH
Creatinine, Ser: 7.76 — ABNORMAL HIGH
Creatinine, Ser: 9.09 — ABNORMAL HIGH
GFR calc Af Amer: 16 — ABNORMAL LOW
GFR calc Af Amer: 43 — ABNORMAL LOW
GFR calc non Af Amer: 36 — ABNORMAL LOW
GFR calc non Af Amer: 8 — ABNORMAL LOW
Glucose, Bld: 185 — ABNORMAL HIGH
Glucose, Bld: 191 — ABNORMAL HIGH
Glucose, Bld: 326 — ABNORMAL HIGH
Potassium: 2.9 — ABNORMAL LOW
Potassium: 3.2 — ABNORMAL LOW
Potassium: 3.2 — ABNORMAL LOW
Potassium: 3.7
Potassium: 3.8
Sodium: 141
Sodium: 151 — ABNORMAL HIGH
Sodium: 155 — ABNORMAL HIGH
Total Bilirubin: 0.5
Total Bilirubin: 0.5
Total Bilirubin: 0.6
Total Protein: 4.6 — ABNORMAL LOW
Total Protein: 5.9 — ABNORMAL LOW
Total Protein: 6.4

## 2011-07-14 LAB — DIFFERENTIAL
Basophils Absolute: 0
Basophils Relative: 0
Basophils Relative: 0
Basophils Relative: 0
Blasts: 0
Eosinophils Absolute: 0.1
Eosinophils Absolute: 0.2
Eosinophils Absolute: 0.2
Eosinophils Relative: 1
Eosinophils Relative: 2
Lymphocytes Relative: 12
Lymphocytes Relative: 16
Lymphocytes Relative: 18
Lymphs Abs: 1.9
Lymphs Abs: 2.1
Monocytes Absolute: 0.7
Monocytes Absolute: 0.9
Monocytes Absolute: 1.4 — ABNORMAL HIGH
Monocytes Relative: 12
Monocytes Relative: 9
Neutro Abs: 12.4 — ABNORMAL HIGH
Neutro Abs: 7.6
Neutrophils Relative %: 52
Neutrophils Relative %: 74
Neutrophils Relative %: 78 — ABNORMAL HIGH
Promyelocytes Absolute: 0
Smear Review: DECREASED
nRBC: 0

## 2011-07-14 LAB — POCT I-STAT 3, ART BLOOD GAS (G3+)
Acid-base deficit: 3 — ABNORMAL HIGH
Acid-base deficit: 5 — ABNORMAL HIGH
Acid-base deficit: 7 — ABNORMAL HIGH
Acid-base deficit: 8 — ABNORMAL HIGH
Bicarbonate: 17.3 — ABNORMAL LOW
Bicarbonate: 19 — ABNORMAL LOW
Bicarbonate: 20
O2 Saturation: 100
O2 Saturation: 97
O2 Saturation: 98
O2 Saturation: 98
O2 Saturation: 99
O2 Saturation: 99
Operator id: 155841
Operator id: 239331
Operator id: 257081
Operator id: 280981
Operator id: 282241
Operator id: 287601
Patient temperature: 100
Patient temperature: 100.9
Patient temperature: 98.4
Patient temperature: 99.1
TCO2: 18
TCO2: 19
TCO2: 21
TCO2: 21
TCO2: 22
TCO2: 31
pCO2 arterial: 22.9 — ABNORMAL LOW
pCO2 arterial: 32.3 — ABNORMAL LOW
pCO2 arterial: 34.5 — ABNORMAL LOW
pCO2 arterial: 35.6
pCO2 arterial: 37.3
pCO2 arterial: 40.7
pCO2 arterial: 45.2 — ABNORMAL HIGH
pH, Arterial: 7.3 — ABNORMAL LOW
pH, Arterial: 7.314 — ABNORMAL LOW
pH, Arterial: 7.332 — ABNORMAL LOW
pH, Arterial: 7.337 — ABNORMAL LOW
pH, Arterial: 7.376
pH, Arterial: 7.382
pH, Arterial: 7.424
pO2, Arterial: 115 — ABNORMAL HIGH
pO2, Arterial: 124 — ABNORMAL HIGH
pO2, Arterial: 151 — ABNORMAL HIGH
pO2, Arterial: 171 — ABNORMAL HIGH
pO2, Arterial: 93

## 2011-07-14 LAB — CULTURE, BLOOD (ROUTINE X 2)
Culture: NO GROWTH
Culture: NO GROWTH

## 2011-07-14 LAB — CHOLESTEROL, TOTAL
Cholesterol: 127
Cholesterol: 132
Cholesterol: 155

## 2011-07-14 LAB — PROTIME-INR
INR: 1.1
INR: 1.2
Prothrombin Time: 15.4 — ABNORMAL HIGH

## 2011-07-14 LAB — VANCOMYCIN, RANDOM
Vancomycin Rm: 19.3
Vancomycin Rm: 9.4

## 2011-07-14 LAB — FUNGUS CULTURE, BLOOD

## 2011-07-14 LAB — MAGNESIUM
Magnesium: 1.5
Magnesium: 2.6 — ABNORMAL HIGH
Magnesium: 2.6 — ABNORMAL HIGH

## 2011-07-14 LAB — FERRITIN: Ferritin: 886 — ABNORMAL HIGH (ref 22–322)

## 2011-07-14 LAB — URINE MICROSCOPIC-ADD ON

## 2011-07-14 LAB — CATH TIP CULTURE: Culture: NO GROWTH

## 2011-07-14 LAB — TRIGLYCERIDES
Triglycerides: 206 — ABNORMAL HIGH
Triglycerides: 211 — ABNORMAL HIGH
Triglycerides: 239 — ABNORMAL HIGH

## 2011-07-14 LAB — CARDIAC PANEL(CRET KIN+CKTOT+MB+TROPI)
Relative Index: 0.1
Total CK: 5167 — ABNORMAL HIGH
Troponin I: 0.79

## 2011-07-14 LAB — URINALYSIS, ROUTINE W REFLEX MICROSCOPIC
Bilirubin Urine: NEGATIVE
Glucose, UA: NEGATIVE
Ketones, ur: NEGATIVE
Protein, ur: 100 — AB

## 2011-07-14 LAB — GIARDIA/CRYPTOSPORIDIUM SCREEN(EIA): Giardia Screen - EIA: NEGATIVE

## 2011-07-14 LAB — URINE CULTURE
Colony Count: NO GROWTH
Colony Count: NO GROWTH
Culture: NO GROWTH
Special Requests: NEGATIVE

## 2011-07-14 LAB — IRON AND TIBC
Saturation Ratios: 23
TIBC: 213 — ABNORMAL LOW
UIBC: 165

## 2011-07-14 LAB — PREALBUMIN: Prealbumin: 18.9

## 2011-07-14 LAB — CLOSTRIDIUM DIFFICILE EIA: C difficile Toxins A+B, EIA: NEGATIVE

## 2011-07-14 LAB — CULTURE, RESPIRATORY W GRAM STAIN

## 2011-07-14 LAB — FECAL LACTOFERRIN, QUANT

## 2011-07-14 LAB — APTT
aPTT: 27
aPTT: 28

## 2011-07-14 LAB — PHOSPHORUS: Phosphorus: 4.5

## 2011-07-14 LAB — AMYLASE: Amylase: 96

## 2011-07-26 ENCOUNTER — Ambulatory Visit (INDEPENDENT_AMBULATORY_CARE_PROVIDER_SITE_OTHER): Payer: Self-pay | Admitting: Internal Medicine

## 2011-07-26 VITALS — BP 115/75 | HR 83 | Temp 97.9°F | Wt 265.6 lb

## 2011-07-26 DIAGNOSIS — E109 Type 1 diabetes mellitus without complications: Secondary | ICD-10-CM

## 2011-07-26 DIAGNOSIS — H5789 Other specified disorders of eye and adnexa: Secondary | ICD-10-CM

## 2011-07-26 DIAGNOSIS — F17200 Nicotine dependence, unspecified, uncomplicated: Secondary | ICD-10-CM

## 2011-07-26 DIAGNOSIS — I10 Essential (primary) hypertension: Secondary | ICD-10-CM

## 2011-07-26 MED ORDER — IBUPROFEN 600 MG PO TABS: 600.0000 mg | ORAL_TABLET | Freq: Four times a day (QID) | ORAL | Status: AC | PRN

## 2011-07-26 NOTE — Patient Instructions (Signed)
Warm compresses as discussed 3-4 times a day. Set up an appointment with [place] if you see any discharge, difficulty in vision or extreme pain.

## 2011-07-26 NOTE — Assessment & Plan Note (Signed)
Counseled for smoking cessation patient not treated to quit at this time.

## 2011-07-26 NOTE — Assessment & Plan Note (Addendum)
At goal with current medication. Check creatinine and potassium today.
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
## 2011-07-27 LAB — COMPLETE METABOLIC PANEL WITH GFR
ALT: 20 U/L (ref 0–53)
AST: 15 U/L (ref 0–37)
Albumin: 4.5 g/dL (ref 3.5–5.2)
Alkaline Phosphatase: 59 U/L (ref 39–117)
BUN: 12 mg/dL (ref 6–23)
CO2: 24 mEq/L (ref 19–32)
Calcium: 9.6 mg/dL (ref 8.4–10.5)
Chloride: 104 mEq/L (ref 96–112)
Creat: 1.12 mg/dL (ref 0.50–1.35)
GFR, Est African American: >60 mL/min (ref >60)
GFR, Est Non African American: >60 mL/min (ref >60)
Glucose, Bld: 130 mg/dL — ABNORMAL HIGH (ref 70–99)
Potassium: 4.3 mEq/L (ref 3.5–5.3)
Sodium: 140 mEq/L (ref 135–145)
Total Bilirubin: 0.5 mg/dL (ref 0.3–1.2)
Total Protein: 6.9 g/dL (ref 6.0–8.3)

## 2011-07-29 LAB — RENAL FUNCTION PANEL
Albumin: 1.7 — ABNORMAL LOW
Albumin: 1.7 — ABNORMAL LOW
Albumin: 1.8 — ABNORMAL LOW
Albumin: 1.8 — ABNORMAL LOW
Albumin: 1.8 — ABNORMAL LOW
Albumin: 1.8 — ABNORMAL LOW
Albumin: 1.9 — ABNORMAL LOW
Albumin: 1.9 — ABNORMAL LOW
Albumin: 1.9 — ABNORMAL LOW
Albumin: 2 — ABNORMAL LOW
Albumin: 2 — ABNORMAL LOW
Albumin: 2 — ABNORMAL LOW
Albumin: 2 — ABNORMAL LOW
Albumin: 2.1 — ABNORMAL LOW
Albumin: 2.5 — ABNORMAL LOW
Albumin: 2.5 — ABNORMAL LOW
Albumin: 2.6 — ABNORMAL LOW
Albumin: 2.6 — ABNORMAL LOW
BUN: 57 — ABNORMAL HIGH
BUN: 58 — ABNORMAL HIGH
BUN: 61 — ABNORMAL HIGH
BUN: 61 — ABNORMAL HIGH
BUN: 64 — ABNORMAL HIGH
BUN: 69 — ABNORMAL HIGH
BUN: 74 — ABNORMAL HIGH
BUN: 81 — ABNORMAL HIGH
BUN: 81 — ABNORMAL HIGH
BUN: 82 — ABNORMAL HIGH
BUN: 85 — ABNORMAL HIGH
BUN: 86 — ABNORMAL HIGH
BUN: 86 — ABNORMAL HIGH
BUN: 88 — ABNORMAL HIGH
BUN: 89 — ABNORMAL HIGH
BUN: 89 — ABNORMAL HIGH
BUN: 89 — ABNORMAL HIGH
BUN: 90 — ABNORMAL HIGH
BUN: 92 — ABNORMAL HIGH
BUN: 93 — ABNORMAL HIGH
CO2: 10 — ABNORMAL LOW
CO2: 10 — ABNORMAL LOW
CO2: 12 — ABNORMAL LOW
CO2: 13 — ABNORMAL LOW
CO2: 13 — ABNORMAL LOW
CO2: 14 — ABNORMAL LOW
CO2: 16 — ABNORMAL LOW
CO2: 16 — ABNORMAL LOW
CO2: 16 — ABNORMAL LOW
CO2: 16 — ABNORMAL LOW
CO2: 17 — ABNORMAL LOW
CO2: 17 — ABNORMAL LOW
CO2: 17 — ABNORMAL LOW
CO2: 18 — ABNORMAL LOW
CO2: 18 — ABNORMAL LOW
CO2: 19
Calcium: 6.3 — CL
Calcium: 6.3 — CL
Calcium: 6.4 — CL
Calcium: 6.6 — ABNORMAL LOW
Calcium: 6.6 — ABNORMAL LOW
Calcium: 6.6 — ABNORMAL LOW
Calcium: 6.8 — ABNORMAL LOW
Calcium: 6.9 — ABNORMAL LOW
Calcium: 6.9 — ABNORMAL LOW
Calcium: 7 — ABNORMAL LOW
Calcium: 7.1 — ABNORMAL LOW
Calcium: 7.1 — ABNORMAL LOW
Calcium: 7.2 — ABNORMAL LOW
Calcium: 7.4 — ABNORMAL LOW
Calcium: 7.8 — ABNORMAL LOW
Calcium: 7.9 — ABNORMAL LOW
Calcium: 8 — ABNORMAL LOW
Calcium: 8 — ABNORMAL LOW
Calcium: 8.1 — ABNORMAL LOW
Chloride: 119 — ABNORMAL HIGH
Chloride: 119 — ABNORMAL HIGH
Chloride: 120 — ABNORMAL HIGH
Chloride: 121 — ABNORMAL HIGH
Chloride: 121 — ABNORMAL HIGH
Chloride: 122 — ABNORMAL HIGH
Chloride: 123 — ABNORMAL HIGH
Chloride: 123 — ABNORMAL HIGH
Chloride: 123 — ABNORMAL HIGH
Chloride: 124 — ABNORMAL HIGH
Chloride: 124 — ABNORMAL HIGH
Chloride: 126 — ABNORMAL HIGH
Chloride: 127 — ABNORMAL HIGH
Chloride: 127 — ABNORMAL HIGH
Chloride: 128 — ABNORMAL HIGH
Creatinine, Ser: 5.63 — ABNORMAL HIGH
Creatinine, Ser: 5.81 — ABNORMAL HIGH
Creatinine, Ser: 6.07 — ABNORMAL HIGH
Creatinine, Ser: 6.35 — ABNORMAL HIGH
Creatinine, Ser: 6.37 — ABNORMAL HIGH
Creatinine, Ser: 6.38 — ABNORMAL HIGH
Creatinine, Ser: 6.66 — ABNORMAL HIGH
Creatinine, Ser: 7.17 — ABNORMAL HIGH
Creatinine, Ser: 7.19 — ABNORMAL HIGH
Creatinine, Ser: 7.23 — ABNORMAL HIGH
Creatinine, Ser: 7.35 — ABNORMAL HIGH
Creatinine, Ser: 7.42 — ABNORMAL HIGH
Creatinine, Ser: 7.5 — ABNORMAL HIGH
Creatinine, Ser: 7.57 — ABNORMAL HIGH
Creatinine, Ser: 7.68 — ABNORMAL HIGH
Creatinine, Ser: 7.82 — ABNORMAL HIGH
Creatinine, Ser: 7.82 — ABNORMAL HIGH
Creatinine, Ser: 7.93 — ABNORMAL HIGH
Creatinine, Ser: 8.05 — ABNORMAL HIGH
Creatinine, Ser: 8.25 — ABNORMAL HIGH
Creatinine, Ser: 8.32 — ABNORMAL HIGH
Creatinine, Ser: 9.07 — ABNORMAL HIGH
GFR calc Af Amer: 10 — ABNORMAL LOW
GFR calc Af Amer: 10 — ABNORMAL LOW
GFR calc Af Amer: 10 — ABNORMAL LOW
GFR calc Af Amer: 10 — ABNORMAL LOW
GFR calc Af Amer: 10 — ABNORMAL LOW
GFR calc Af Amer: 11 — ABNORMAL LOW
GFR calc Af Amer: 11 — ABNORMAL LOW
GFR calc Af Amer: 11 — ABNORMAL LOW
GFR calc Af Amer: 12 — ABNORMAL LOW
GFR calc Af Amer: 12 — ABNORMAL LOW
GFR calc Af Amer: 13 — ABNORMAL LOW
GFR calc Af Amer: 14 — ABNORMAL LOW
GFR calc Af Amer: 15 — ABNORMAL LOW
GFR calc Af Amer: 8 — ABNORMAL LOW
GFR calc Af Amer: 9 — ABNORMAL LOW
GFR calc non Af Amer: 10 — ABNORMAL LOW
GFR calc non Af Amer: 10 — ABNORMAL LOW
GFR calc non Af Amer: 12 — ABNORMAL LOW
GFR calc non Af Amer: 12 — ABNORMAL LOW
GFR calc non Af Amer: 7 — ABNORMAL LOW
GFR calc non Af Amer: 7 — ABNORMAL LOW
GFR calc non Af Amer: 8 — ABNORMAL LOW
GFR calc non Af Amer: 8 — ABNORMAL LOW
GFR calc non Af Amer: 8 — ABNORMAL LOW
GFR calc non Af Amer: 8 — ABNORMAL LOW
GFR calc non Af Amer: 8 — ABNORMAL LOW
GFR calc non Af Amer: 9 — ABNORMAL LOW
GFR calc non Af Amer: 9 — ABNORMAL LOW
GFR calc non Af Amer: 9 — ABNORMAL LOW
GFR calc non Af Amer: 9 — ABNORMAL LOW
Glucose, Bld: 106 — ABNORMAL HIGH
Glucose, Bld: 109 — ABNORMAL HIGH
Glucose, Bld: 118 — ABNORMAL HIGH
Glucose, Bld: 144 — ABNORMAL HIGH
Glucose, Bld: 155 — ABNORMAL HIGH
Glucose, Bld: 161 — ABNORMAL HIGH
Glucose, Bld: 177 — ABNORMAL HIGH
Glucose, Bld: 184 — ABNORMAL HIGH
Glucose, Bld: 226 — ABNORMAL HIGH
Glucose, Bld: 231 — ABNORMAL HIGH
Glucose, Bld: 246 — ABNORMAL HIGH
Glucose, Bld: 251 — ABNORMAL HIGH
Glucose, Bld: 283 — ABNORMAL HIGH
Glucose, Bld: 311 — ABNORMAL HIGH
Glucose, Bld: 360 — ABNORMAL HIGH
Glucose, Bld: 366 — ABNORMAL HIGH
Glucose, Bld: 411 — ABNORMAL HIGH
Glucose, Bld: 419 — ABNORMAL HIGH
Glucose, Bld: 443 — ABNORMAL HIGH
Glucose, Bld: 85
Glucose, Bld: 85
Phosphorus: 1.3 — ABNORMAL LOW
Phosphorus: 1.3 — ABNORMAL LOW
Phosphorus: 2.1 — ABNORMAL LOW
Phosphorus: 2.3
Phosphorus: 2.7
Phosphorus: 4.1
Phosphorus: 4.9 — ABNORMAL HIGH
Phosphorus: 5 — ABNORMAL HIGH
Phosphorus: 5 — ABNORMAL HIGH
Phosphorus: 5.1 — ABNORMAL HIGH
Phosphorus: 5.1 — ABNORMAL HIGH
Phosphorus: 5.2 — ABNORMAL HIGH
Phosphorus: 5.3 — ABNORMAL HIGH
Phosphorus: 5.3 — ABNORMAL HIGH
Phosphorus: 5.6 — ABNORMAL HIGH
Phosphorus: <1 — CL
Phosphorus: <1 — CL
Potassium: 3.2 — ABNORMAL LOW
Potassium: 3.4 — ABNORMAL LOW
Potassium: 3.4 — ABNORMAL LOW
Potassium: 3.5
Potassium: 3.5
Potassium: 3.6
Potassium: 3.6
Potassium: 3.7
Potassium: 3.7
Potassium: 4.1
Potassium: 4.1
Potassium: 4.2
Potassium: 4.4
Potassium: 4.8
Sodium: 143
Sodium: 144
Sodium: 145
Sodium: 145
Sodium: 147 — ABNORMAL HIGH
Sodium: 147 — ABNORMAL HIGH
Sodium: 148 — ABNORMAL HIGH
Sodium: 148 — ABNORMAL HIGH
Sodium: 148 — ABNORMAL HIGH
Sodium: 149 — ABNORMAL HIGH
Sodium: 149 — ABNORMAL HIGH
Sodium: 150 — ABNORMAL HIGH
Sodium: 150 — ABNORMAL HIGH

## 2011-07-29 LAB — BASIC METABOLIC PANEL
BUN: 58 — ABNORMAL HIGH
BUN: 59 — ABNORMAL HIGH
BUN: 74 — ABNORMAL HIGH
BUN: 88 — ABNORMAL HIGH
CO2: 10 — ABNORMAL LOW
CO2: 17 — ABNORMAL LOW
CO2: 17 — ABNORMAL LOW
Calcium: 7.1 — ABNORMAL LOW
Chloride: 104
Chloride: 108
Chloride: 123 — ABNORMAL HIGH
Chloride: 123 — ABNORMAL HIGH
Creatinine, Ser: 5.58 — ABNORMAL HIGH
Creatinine, Ser: 8.49 — ABNORMAL HIGH
GFR calc Af Amer: 9 — ABNORMAL LOW
GFR calc non Af Amer: 14 — ABNORMAL LOW
GFR calc non Af Amer: 8 — ABNORMAL LOW
GFR calc non Af Amer: 8 — ABNORMAL LOW
GFR calc non Af Amer: 9 — ABNORMAL LOW
Glucose, Bld: 338 — ABNORMAL HIGH
Glucose, Bld: 362 — ABNORMAL HIGH
Glucose, Bld: 657
Glucose, Bld: 806
Glucose, Bld: 928
Potassium: 4
Potassium: 4.2
Potassium: 4.7
Potassium: 5.2 — ABNORMAL HIGH
Sodium: 133 — ABNORMAL LOW
Sodium: 145

## 2011-07-29 LAB — I-STAT 8, (EC8 V) (CONVERTED LAB)
Acid-base deficit: 15 — ABNORMAL HIGH
Chloride: 96
HCT: 60 — ABNORMAL HIGH
Operator id: 285491
Potassium: 4
Sodium: 125 — ABNORMAL LOW
TCO2: 14
pH, Ven: 7.141 — CL

## 2011-07-29 LAB — RAPID URINE DRUG SCREEN, HOSP PERFORMED
Benzodiazepines: NOT DETECTED
Cocaine: NOT DETECTED
Opiates: NOT DETECTED

## 2011-07-29 LAB — LIPID PANEL
Cholesterol: 211 — ABNORMAL HIGH
HDL: 13 — ABNORMAL LOW
HDL: 15 — ABNORMAL LOW
LDL Cholesterol: UNDETERMINED
LDL Cholesterol: UNDETERMINED
Total CHOL/HDL Ratio: 16.1
Total CHOL/HDL Ratio: 16.2
Triglycerides: 820 — ABNORMAL HIGH
VLDL: UNDETERMINED
VLDL: UNDETERMINED

## 2011-07-29 LAB — CBC
HCT: 27.2 — ABNORMAL LOW
HCT: 27.3 — ABNORMAL LOW
HCT: 29.8 — ABNORMAL LOW
HCT: 31.9 — ABNORMAL LOW
HCT: 34.3 — ABNORMAL LOW
HCT: 54.7 — ABNORMAL HIGH
Hemoglobin: 10.8 — ABNORMAL LOW
Hemoglobin: 12.8 — ABNORMAL LOW
Hemoglobin: 14.8
Hemoglobin: 17.4 — ABNORMAL HIGH
Hemoglobin: 9.4 — ABNORMAL LOW
Hemoglobin: 9.5 — ABNORMAL LOW
MCHC: 33.2
MCHC: 33.9
MCHC: 34.2
MCHC: 34.4
MCV: 74.6 — ABNORMAL LOW
MCV: 74.6 — ABNORMAL LOW
MCV: 74.6 — ABNORMAL LOW
MCV: 76 — ABNORMAL LOW
MCV: 77.3 — ABNORMAL LOW
MCV: 78.9
MCV: 79
Platelets: 172
Platelets: 76 — ABNORMAL LOW
Platelets: 87 — ABNORMAL LOW
Platelets: 87 — ABNORMAL LOW
Platelets: 89 — ABNORMAL LOW
Platelets: 89 — ABNORMAL LOW
Platelets: 92 — ABNORMAL LOW
RBC: 3.61 — ABNORMAL LOW
RBC: 3.77 — ABNORMAL LOW
RBC: 4.99
RBC: 5.79
RBC: 6.78 — ABNORMAL HIGH
RDW: 12.9
RDW: 13.4
RDW: 13.6
RDW: 14.1
RDW: 15.2
RDW: 15.3
RDW: 15.5
WBC: 11.2 — ABNORMAL HIGH
WBC: 12.7 — ABNORMAL HIGH
WBC: 9.7
WBC: 9.7
WBC: 9.9
WBC: 9.9

## 2011-07-29 LAB — POCT I-STAT 3, ART BLOOD GAS (G3+)
Acid-base deficit: 10 — ABNORMAL HIGH
Acid-base deficit: 13 — ABNORMAL HIGH
Acid-base deficit: 15 — ABNORMAL HIGH
Bicarbonate: 10.3 — ABNORMAL LOW
Bicarbonate: 12.1 — ABNORMAL LOW
Bicarbonate: 12.9 — ABNORMAL LOW
Bicarbonate: 13.8 — ABNORMAL LOW
O2 Saturation: 96
Operator id: 235881
Operator id: 285131
Operator id: 296031
Patient temperature: 100.4
Patient temperature: 99.3
TCO2: 11
TCO2: 15
pCO2 arterial: 19.5 — CL
pCO2 arterial: 25.2 — ABNORMAL LOW
pH, Arterial: 7.269 — ABNORMAL LOW
pH, Arterial: 7.346 — ABNORMAL LOW
pO2, Arterial: 128 — ABNORMAL HIGH
pO2, Arterial: 142 — ABNORMAL HIGH
pO2, Arterial: 85

## 2011-07-29 LAB — TYPE AND SCREEN

## 2011-07-29 LAB — DIFFERENTIAL
Basophils Absolute: 0
Basophils Absolute: 0.1
Eosinophils Absolute: 0
Eosinophils Relative: 0
Lymphocytes Relative: 7 — ABNORMAL LOW
Lymphs Abs: 0.7
Monocytes Absolute: 0.8
Monocytes Relative: 6
Neutro Abs: 12.4 — ABNORMAL HIGH
Neutrophils Relative %: 83 — ABNORMAL HIGH

## 2011-07-29 LAB — URINE CULTURE
Colony Count: NO GROWTH
Culture: NO GROWTH

## 2011-07-29 LAB — COMPREHENSIVE METABOLIC PANEL
ALT: 36
AST: 42 — ABNORMAL HIGH
Alkaline Phosphatase: 68
Alkaline Phosphatase: 80
BUN: 58 — ABNORMAL HIGH
CO2: 13 — ABNORMAL LOW
Calcium: 8.1 — ABNORMAL LOW
Chloride: 100
Chloride: 85 — ABNORMAL LOW
GFR calc Af Amer: 17 — ABNORMAL LOW
GFR calc non Af Amer: 14 — ABNORMAL LOW
GFR calc non Af Amer: 14 — ABNORMAL LOW
Glucose, Bld: 1194
Potassium: 4
Potassium: 4
Sodium: 130 — ABNORMAL LOW
Total Bilirubin: 1.4 — ABNORMAL HIGH

## 2011-07-29 LAB — CARDIAC PANEL(CRET KIN+CKTOT+MB+TROPI)
CK, MB: 17.7 — ABNORMAL HIGH
CK, MB: 4.9 — ABNORMAL HIGH
CK, MB: 8.4 — ABNORMAL HIGH
CK, MB: 9.4 — ABNORMAL HIGH
Relative Index: 0
Relative Index: 0
Relative Index: 0
Relative Index: 0
Relative Index: 0.7
Total CK: 19110 — ABNORMAL HIGH
Total CK: 748 — ABNORMAL HIGH
Total CK: 7853 — ABNORMAL HIGH
Total CK: 9652 — ABNORMAL HIGH
Troponin I: 0.05
Troponin I: 0.06
Troponin I: 0.13 — ABNORMAL HIGH
Troponin I: 0.41 — ABNORMAL HIGH

## 2011-07-29 LAB — PROTEIN / CREATININE RATIO, URINE: Total Protein, Urine: 62

## 2011-07-29 LAB — HEPATIC FUNCTION PANEL
ALT: 46
AST: 151 — ABNORMAL HIGH
Albumin: 1.8 — ABNORMAL LOW
Bilirubin, Direct: 0.1
Total Bilirubin: 0.5

## 2011-07-29 LAB — KETONES, QUALITATIVE

## 2011-07-29 LAB — PREPARE FRESH FROZEN PLASMA

## 2011-07-29 LAB — URINALYSIS, ROUTINE W REFLEX MICROSCOPIC
Bilirubin Urine: NEGATIVE
Glucose, UA: >1000 — AB
Glucose, UA: NEGATIVE
Ketones, ur: 15 — AB
Leukocytes, UA: NEGATIVE
Nitrite: NEGATIVE
Specific Gravity, Urine: 1.033 — ABNORMAL HIGH
pH: 6
pH: 6

## 2011-07-29 LAB — CROSSMATCH
ABO/RH(D): O POS
Antibody Screen: NEGATIVE

## 2011-07-29 LAB — CK: Total CK: 7947 — ABNORMAL HIGH

## 2011-07-29 LAB — HEPATITIS PANEL, ACUTE
HCV Ab: NEGATIVE
Hep A IgM: NEGATIVE
Hep B C IgM: NEGATIVE

## 2011-07-29 LAB — URINE MICROSCOPIC-ADD ON

## 2011-07-29 LAB — OSMOLALITY: Osmolality: 371 — ABNORMAL HIGH

## 2011-07-29 LAB — DIC (DISSEMINATED INTRAVASCULAR COAGULATION)PANEL
D-Dimer, Quant: >20 — ABNORMAL HIGH
INR: 1.4
aPTT: 28

## 2011-07-29 LAB — LACTIC ACID, PLASMA
Lactic Acid, Venous: 1.3
Lactic Acid, Venous: 3.3 — ABNORMAL HIGH

## 2011-07-29 LAB — PROTIME-INR
INR: 1.4
Prothrombin Time: 17.2 — ABNORMAL HIGH

## 2011-07-29 LAB — MICROALBUMIN / CREATININE URINE RATIO: Microalb Creat Ratio: 367.5 — ABNORMAL HIGH

## 2011-07-29 LAB — CULTURE, BLOOD (ROUTINE X 2)

## 2011-07-29 LAB — LIPASE, BLOOD
Lipase: 1790 — ABNORMAL HIGH
Lipase: 4638 — ABNORMAL HIGH
Lipase: 788 — ABNORMAL HIGH

## 2011-07-29 LAB — C-PEPTIDE: C-Peptide: <0.05 — ABNORMAL LOW

## 2011-07-29 LAB — TSH: TSH: 0.656

## 2011-07-29 LAB — TRICYCLICS SCREEN, URINE: TCA Scrn: NOT DETECTED

## 2011-07-29 LAB — APTT: aPTT: 31

## 2011-07-29 LAB — ABO/RH: ABO/RH(D): O POS

## 2011-07-29 LAB — MAGNESIUM: Magnesium: 3.4 — ABNORMAL HIGH

## 2011-07-29 LAB — CALCIUM, IONIZED: Calcium, Ion: 0.98 — ABNORMAL LOW

## 2011-07-29 LAB — HIV ANTIBODY (ROUTINE TESTING W REFLEX): HIV: NONREACTIVE

## 2011-08-11 LAB — BASIC METABOLIC PANEL
CO2: 28
Calcium: 9.2
Creatinine, Ser: 1.3
GFR calc Af Amer: >60
GFR calc non Af Amer: >60
Glucose, Bld: 97

## 2011-08-11 LAB — RAPID URINE DRUG SCREEN, HOSP PERFORMED
Amphetamines: NOT DETECTED
Barbiturates: NOT DETECTED
Benzodiazepines: POSITIVE — AB
Cocaine: NOT DETECTED
Opiates: NOT DETECTED
Tetrahydrocannabinol: POSITIVE — AB

## 2011-08-11 LAB — ETHANOL

## 2011-08-11 LAB — CBC
HCT: 44
Hemoglobin: 14.4
MCHC: 32.7
MCV: 78.8
Platelets: 278
RBC: 5.59
RDW: 14.3 — ABNORMAL HIGH
WBC: 11.1 — ABNORMAL HIGH

## 2011-08-11 LAB — URINE MICROSCOPIC-ADD ON

## 2011-08-11 LAB — URINALYSIS, ROUTINE W REFLEX MICROSCOPIC
Hgb urine dipstick: NEGATIVE
Nitrite: NEGATIVE
Protein, ur: 30 — AB
Specific Gravity, Urine: 1.028
Urobilinogen, UA: 1

## 2011-08-11 LAB — DIFFERENTIAL
Basophils Absolute: 0.1
Basophils Relative: 1
Neutro Abs: 7.9 — ABNORMAL HIGH
Neutrophils Relative %: 71

## 2011-09-22 ENCOUNTER — Encounter: Payer: Self-pay | Admitting: Internal Medicine

## 2011-12-28 ENCOUNTER — Encounter: Payer: Self-pay | Admitting: Internal Medicine

## 2011-12-28 ENCOUNTER — Ambulatory Visit (INDEPENDENT_AMBULATORY_CARE_PROVIDER_SITE_OTHER): Payer: Self-pay | Admitting: Internal Medicine

## 2011-12-28 VITALS — BP 125/81 | HR 85 | Temp 98.1°F | Ht 72.0 in | Wt 267.3 lb

## 2011-12-28 DIAGNOSIS — Z79899 Other long term (current) drug therapy: Secondary | ICD-10-CM

## 2011-12-28 DIAGNOSIS — E109 Type 1 diabetes mellitus without complications: Secondary | ICD-10-CM

## 2011-12-28 DIAGNOSIS — E785 Hyperlipidemia, unspecified: Secondary | ICD-10-CM

## 2011-12-28 DIAGNOSIS — I10 Essential (primary) hypertension: Secondary | ICD-10-CM

## 2011-12-28 LAB — COMPREHENSIVE METABOLIC PANEL
AST: 14 U/L (ref 0–37)
Albumin: 3.9 g/dL (ref 3.5–5.2)
Alkaline Phosphatase: 106 U/L (ref 39–117)
BUN: 11 mg/dL (ref 6–23)
CO2: 25 mEq/L (ref 19–32)
Calcium: 9.5 mg/dL (ref 8.4–10.5)
Creat: 0.92 mg/dL (ref 0.50–1.35)
Total Bilirubin: 0.3 mg/dL (ref 0.3–1.2)

## 2011-12-28 LAB — GLUCOSE, CAPILLARY: Glucose-Capillary: 500 mg/dL — ABNORMAL HIGH (ref 70–99)

## 2011-12-28 LAB — POCT GLYCOSYLATED HEMOGLOBIN (HGB A1C): Hemoglobin A1C: 11.8

## 2011-12-28 NOTE — Progress Notes (Signed)
Subjective:     Patient ID: Connor Cook, male   DOB: May 08, 1981, 31 y.o.   MRN: [id number]  HPI  patient is a very tall male with type 1 diabetes he presents today for routine followup. On arrival his CBG is 500. He states he is not checking his blood sugars; he last checked a CBG "weeks" ago.  He states he has a functional meter but just has not been using it. He states that he takes 10 units of insulin once daily; per chart review he is supposed to be taking 10 units of insulin with meals in addition to her 50 units of insulin each bedtime.  He states he has a full supply of insulin and has not run out of any medications.  He denies any symptoms of hypoglycemia. He denies chest pain, shortness of breath, dyspnea on exertion, cough, fever, chills, abdominal pain, nausea, vomiting, diarrhea, headaches, difficulty walking, or other complaint.  He states he is in his usual state of health and feels fine.  Discussed the importance of CBG control for type I diabetics to avoid diabetic ketoacidosis.  Patient states he has never heard of DKA or has never experienced DKA. Educated the patient about DKA and the potential for significant adverse effects including coma and potentially death.     Review of Systems Review of Systems  Constitutional: Negative for fever, chills, diaphoresis, activity change, appetite change, fatigue and unexpected weight change.  HENT: Negative for hearing loss, congestion and neck stiffness.   Eyes: Negative for photophobia, pain and visual disturbance.  Respiratory: Negative for cough, chest tightness, shortness of breath and wheezing.   Cardiovascular: Negative for chest pain and palpitations.  Gastrointestinal: Negative for abdominal pain, blood in stool and anal bleeding.  Genitourinary: Negative for dysuria, hematuria and difficulty urinating.  Musculoskeletal: Negative for joint swelling.  Neurological: Negative for dizziness, syncope, speech difficulty, weakness,  numbness and headaches.      Objective:   Physical Exam Vital signs reviewed and stable. GEN: No apparent distress.  Alert and oriented x 3.  Affect is flat.  Pt minimally conversant. HEENT: head is autraumatic and normocephalic.  Neck is supple without palpable masses or lymphadenopathy.  No JVD or carotid bruits.  Vision intact.  EOMI.  PERRLA.  Sclerae anicteric.  Conjunctivae without pallor or injection. Mucous membranes are moist.  Oropharynx is without erythema, exudates, or other abnormal lesions.   RESP:  Lungs are clear to ascultation bilaterally with good air movement.  No wheezes, ronchi, or rubs. CARDIOVASCULAR: regular rate, normal rhythm.  Clear S1, S2, no murmurs, gallops, or rubs. ABDOMEN: soft, non-tender, non-distended.  Bowels sounds present in all quadrants and normoactive.  No palpable masses. EXT: warm and dry.  Peripheral pulses equal, intact, and +2 globally.  No clubbing or cyanosis.  No edema in bilateral lower extremities. SKIN: warm and dry with normal turgor.  No rashes or abnormal lesions observed. NEURO: CN II-XII grossly intact.  Muscle strength +5/5 in bilateral upper and lower extremities.  Sensation is grossly intact.  No focal deficit.     Assessment:

## 2011-12-28 NOTE — Assessment & Plan Note (Signed)
Blood pressure within normal limits.  Will not make any change to anti-htn regimen at this time.  Will assess renal function today with CMET.

## 2011-12-28 NOTE — Assessment & Plan Note (Addendum)
Patient has very poorly controlled DM1 with A1c today of 11.  I am concerned about the possibility to DKA given CBG of 500 on arrival.  Will check stat BMET; pt agrees to wait for lab results.  Annual foot exam performed today.  Will also obtain urine microalbumin-Cr ratio as well as lipid profile today.  Had a very long discussion with the patient about the importance of improved CBG control to avoid complications from diabetes including DKA (which can cause death, permanent disability, and come), renal failure, blindness, increased risk of infection leading to osteomyelitis and loss of limb, increased risk of stroke and heart disease.  Educated pt about the appropriate use of insulin (Lantus vs mealtime coverage).  Informed pt we will need to increase his Lantus and resume meal-time insulin but he will need to being checking CBGs before we can make significant changes as it is critical to avoid hypoglycemia.   Addendum 4:10pm:  Pt left clinic without informing staff.  His BMET results are still pending.  Will contact him by phone if bmet reveals DKA.

## 2011-12-28 NOTE — Assessment & Plan Note (Signed)
Will check lipid profile today.  

## 2011-12-29 LAB — LIPID PANEL
Cholesterol: 226 mg/dL — ABNORMAL HIGH (ref 0–200)
HDL: 38 mg/dL — ABNORMAL LOW (ref >39)
LDL Cholesterol: 139 mg/dL — ABNORMAL HIGH (ref 0–99)
Total CHOL/HDL Ratio: 5.9 Ratio
Triglycerides: 247 mg/dL — ABNORMAL HIGH (ref <150)

## 2011-12-29 LAB — MICROALBUMIN / CREATININE URINE RATIO: Microalb, Ur: 1.02 mg/dL (ref 0.00–1.89)

## 2012-05-02 ENCOUNTER — Telehealth: Payer: Self-pay | Admitting: Dietician

## 2012-05-02 NOTE — Telephone Encounter (Signed)
Calling to follow up on scheduling diabetes follow up with his physician: patient told front office when they called last month that he was unable to schedule  Due to new employement.

## 2012-05-10 NOTE — Telephone Encounter (Signed)
Called to assist patient with scheduling a follow up with his  Doctor. Patient unavailable. Left message with person who answered phone for patient to call our office to schedule.

## 2013-05-02 ENCOUNTER — Other Ambulatory Visit: Payer: Self-pay

## 2017-03-10 ENCOUNTER — Emergency Department (HOSPITAL_COMMUNITY)
Admission: EM | Admit: 2017-03-10 | Discharge: 2017-03-11 | Disposition: A | Discharge status: Home / Self Care | Payer: Self-pay | Attending: Emergency Medicine | Admitting: Emergency Medicine

## 2017-03-10 ENCOUNTER — Emergency Department (HOSPITAL_COMMUNITY): Payer: Self-pay

## 2017-03-10 ENCOUNTER — Encounter (HOSPITAL_COMMUNITY): Payer: Self-pay

## 2017-03-10 DIAGNOSIS — R739 Hyperglycemia, unspecified: Secondary | ICD-10-CM

## 2017-03-10 DIAGNOSIS — F17200 Nicotine dependence, unspecified, uncomplicated: Secondary | ICD-10-CM | POA: Insufficient documentation

## 2017-03-10 DIAGNOSIS — Z794 Long term (current) use of insulin: Secondary | ICD-10-CM | POA: Insufficient documentation

## 2017-03-10 DIAGNOSIS — E1065 Type 1 diabetes mellitus with hyperglycemia: Secondary | ICD-10-CM | POA: Insufficient documentation

## 2017-03-10 DIAGNOSIS — I10 Essential (primary) hypertension: Secondary | ICD-10-CM | POA: Insufficient documentation

## 2017-03-10 DIAGNOSIS — R0789 Other chest pain: Secondary | ICD-10-CM | POA: Insufficient documentation

## 2017-03-10 DIAGNOSIS — Z79899 Other long term (current) drug therapy: Secondary | ICD-10-CM | POA: Insufficient documentation

## 2017-03-10 LAB — BASIC METABOLIC PANEL
ANION GAP: 9 (ref 5–15)
BUN: 10 mg/dL (ref 6–20)
CALCIUM: 8.8 mg/dL — AB (ref 8.9–10.3)
CO2: 23 mmol/L (ref 22–32)
Chloride: 99 mmol/L — ABNORMAL LOW (ref 101–111)
Creatinine, Ser: 1.15 mg/dL (ref 0.61–1.24)
Glucose, Bld: 414 mg/dL — ABNORMAL HIGH (ref 65–99)
POTASSIUM: 3.7 mmol/L (ref 3.5–5.1)
Sodium: 131 mmol/L — ABNORMAL LOW (ref 135–145)

## 2017-03-10 LAB — CBC
HEMATOCRIT: 48.8 % (ref 39.0–52.0)
HEMOGLOBIN: 16.3 g/dL (ref 13.0–17.0)
MCH: 26.4 pg (ref 26.0–34.0)
MCHC: 33.4 g/dL (ref 30.0–36.0)
MCV: 79.1 fL (ref 78.0–100.0)
Platelets: 218 10*3/uL (ref 150–400)
RBC: 6.17 MIL/uL — AB (ref 4.22–5.81)
RDW: 13.3 % (ref 11.5–15.5)
WBC: 9.3 10*3/uL (ref 4.0–10.5)

## 2017-03-10 LAB — I-STAT TROPONIN, ED: TROPONIN I, POC: 0 ng/mL (ref 0.00–0.08)

## 2017-03-10 MED ORDER — SODIUM CHLORIDE 0.9 % IV BOLUS (SEPSIS)
1000.0000 mL | Freq: Once | INTRAVENOUS | Status: AC
  Administered 2017-03-11: 1000 mL via INTRAVENOUS

## 2017-03-10 NOTE — ED Provider Notes (Signed)
[facility]-EMERGENCY DEPT Provider Note   CSN: [id number] Arrival date & time: 03/10/17  2055  By signing my name below, I, Thelma Barge, attest that this documentation has been prepared under the direction and in the presence of Squire Withey, Jeannett Senior, MD. Electronically Signed: Thelma Barge, Scribe. 03/11/17. 12:03 AM.  History   Chief Complaint Chief Complaint  Patient presents with  . Chest Pain   The history is provided by the patient. No language interpreter was used.   HPI Comments: Connor Cook is a 36 y.o. male with a PMHx of type 1 DM who presents to the Emergency Department complaining of acute, intermittent CP that began 2 days ago. He states the pain does not radiate toward his back or neck. 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 He denies nausea, changes in bowel function, SOB, diaphoresis, or hot flashes. Pt is a smoker. Pt notes he does not take regular insulin doses and is not on a pump. Pt has FMHx of heart disease and his father died at 51.   Past Medical History:  Diagnosis Date  . Aspiration pneumonia (HCC)    History of  . Diabetes mellitus dx dec 2008   ,type1 , dx in 2008 with an episode of DKA  . H/O: upper GI bleed   . History of acute pancreatitis   . History of acute renal failure   . Psychosis    receive fluhenazine injections every 2 weeks at [facility]: contact is Merryl Hacker, labeled as Psychotic disorder NOS.    Patient Active Problem List   Diagnosis Date Noted  . Redness of eye, right 07/26/2011  . TOBACCO ABUSE 05/07/2010  . 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 ACIDOSIS 08/21/2008  . DYSLIPIDEMIA 08/15/2008  . SINUS TACHYCARDIA 12/13/2007  . HYPERTENSION 11/21/2007  . ASPIRATION PNEUMONIA 11/14/2007  . PYURIA 11/14/2007  . PERSONAL HISTORY OF AFFECTIVE DISORDER 11/14/2007  . DIABETES MELLITUS, TYPE I 11/13/2007  . PANCREATITIS, HX OF 11/13/2007  . GASTROINTESTINAL HEMORRHAGE, HX OF  11/13/2007  . RENAL FAILURE, ACUTE, HX OF 11/13/2007    History reviewed. No pertinent surgical history.     Home Medications    Prior to Admission medications   Medication Sig Start Date End Date Taking? Authorizing Provider  fluphenazine (PROLIXIN) 2.5 MG/5ML elixir Take by mouth at bedtime. Take 0.5 ml at intervals directed by [facility] center     [provider]  glucose blood test strip 1 each by Other route as needed. Please use it to check blood sugar 2-3 times a day(early morning, before and after meals)     [provider]  insulin aspart (NOVOLOG) 100 UNIT/ML injection Inject into the skin. 10 units before breakfast and dinner subcutaneously: and add 2 units insulin for every handful of chips consumed.     [provider]  insulin glargine (LANTUS) 100 UNIT/ML injection Inject into the skin at bedtime. Inject 50 units subcutaneously every night at bedtime for diabetes.     [provider]  lisinopril (PRINIVIL,ZESTRIL) 10 MG tablet Take 10 mg by mouth daily.      [provider]  pravastatin (PRAVACHOL) 20 MG tablet Take 20 mg by mouth at bedtime.      [provider]    Family History Family History  Problem Relation Age of Onset  . Heart disease Father   . Hypertension Mother   . Diabetes Mother     Social History  Social History  Substance Use Topics  . Smoking status: Current Every Day Smoker    Packs/day: 1.00    Years: 3.00  . Smokeless tobacco: Never Used  . Alcohol use Yes     Comment: drinks 2-3 drinks on weekend     Allergies   Patient has no known allergies.   Review of Systems Review of Systems  Constitutional: Negative for diaphoresis.  Respiratory: Negative for shortness of breath.   Cardiovascular: Positive for chest pain.  Gastrointestinal: Negative for blood in stool, constipation, diarrhea and nausea.   A complete 10 system review of systems was obtained and all systems  are negative except as noted in the HPI and PMH.    Physical Exam Updated Vital Signs BP 134/84 (BP Location: Right Arm)   Pulse (!) 106   Temp 98.3 F (36.8 C) (Oral)   Resp 18   Ht 6' (1.829 m)   Wt 230 lb (104.3 kg)   SpO2 97%   BMI 31.19 kg/m   Physical Exam  Constitutional: He is oriented to person, place, and time. He appears well-developed and well-nourished. No distress.  Flat affect   HENT:  Head: Normocephalic and atraumatic.  Mouth/Throat: Oropharynx is clear and moist. No oropharyngeal exudate.  Eyes: Conjunctivae and EOM are normal. Pupils are equal, round, and reactive to light.  Neck: Normal range of motion. Neck supple.  No meningismus.  Cardiovascular: Normal rate, regular rhythm, normal heart sounds and intact distal pulses.   No murmur heard. Pulmonary/Chest: Effort normal and breath sounds normal. No respiratory distress.  Abdominal: Soft. There is no tenderness. There is no rebound and no guarding.  Musculoskeletal: Normal range of motion. He exhibits no edema or tenderness.  Chest wall nontender No pain with left arm movement  Neurological: He is alert and oriented to person, place, and time. No cranial nerve deficit. He exhibits normal muscle tone. Coordination normal.   5/5 strength throughout. CN 2-12 intact.Equal grip strength.   Skin: Skin is warm.  Psychiatric: He has a normal mood and affect. His behavior is normal.  Nursing note and vitals reviewed.    ED Treatments / Results  DIAGNOSTIC STUDIES: Oxygen Saturation is 97% on RA, normal by my interpretation.    COORDINATION OF CARE: 11:51 PM Discussed treatment plan with pt at bedside and pt agreed to plan. Labs (all labs ordered are listed, but only abnormal results are displayed) Labs Reviewed  BASIC METABOLIC PANEL - Abnormal; Notable for the following:       Result Value   Sodium 131 (*)    Chloride 99 (*)    Glucose, Bld 414 (*)    Calcium 8.8 (*)    All other components within  normal limits  CBC - Abnormal; Notable for the following:    RBC 6.17 (*)    All other components within normal limits  CBG MONITORING, ED - Abnormal; Notable for the following:    Glucose-Capillary 254 (*)    All other components within normal limits  TROPONIN I  D-DIMER, QUANTITATIVE (NOT AT [facility])  I-STAT TROPOININ, ED    EKG  EKG Interpretation  Date/Time:  Friday Mar 10 2017 21:00:10 EDT Ventricular Rate:  109 PR Interval:  136 QRS Duration: 88 QT Interval:  332 QTC Calculation: 447 R Axis:   -116 Text Interpretation:  Sinus tachycardia Right superior axis deviation Abnormal ECG No significant change was found Confirmed by Glynn Octaveancour, Dona Klemann [phone]([id number]) on 03/10/2017 11:34:42 PM  Radiology Dg Chest 2 View  Result Date: 03/10/2017 CLINICAL DATA:  Left-sided chest pain lifting heavy boxes at work. EXAM: CHEST  2 VIEW COMPARISON:  None. FINDINGS: The cardiomediastinal contours are normal. The lungs are clear. Pulmonary vasculature is normal. No consolidation, pleural effusion, or pneumothorax. No acute osseous abnormalities are seen. IMPRESSION: No acute abnormality. Electronically Signed   By: Rubye Oaks M.D.   On: 03/10/2017 21:58    Procedures Procedures (including critical care time)  Medications Ordered in ED Medications - No data to display   Initial Impression / Assessment and Plan / ED Course  I have reviewed the triage vital signs and the nursing notes.  Pertinent labs & imaging results that were available during my care of the patient were reviewed by me and considered in my medical decision making (see chart for details).    Patient presents with intermittent left-sided chest pain that occurs only when he lifts boxes at work. 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 No chest pain currently. No exertional chest pain when he walks or goes upstairs. No shortness of breath, nausea, diaphoresis,  or syncope.  EKG unchanged. Chest wall nontender.  Heart score 3. Labs with hyperglycemia without DKA.  Troponin negative 2. D-dimer negative. Exam is reassuring. Discussed with patient that ACS is considered less likely at this time but his risk is not 0. Would benefit from outpatient stress test. He has not had any chest pain in the past 2 days. Advised to not lift more than 10 pounds until cleared by his doctor. Return precautions discussed.  Final Clinical Impressions(s) / ED Diagnoses   Final diagnoses:  Atypical chest pain  Hyperglycemia    New Prescriptions New Prescriptions   No medications on file  I personally performed the services described in this documentation, which was scribed in my presence. The recorded information has been reviewed and is accurate.     Glynn Octave, MD 03/11/17 [phone]

## 2017-03-10 NOTE — ED Triage Notes (Signed)
Pt reports left sided chest pain when he lifts heavy boxes at work. No CP now but states the pain is intermittent.

## 2017-03-11 LAB — CBG MONITORING, ED: Glucose-Capillary: 254 mg/dL — ABNORMAL HIGH (ref 65–99)

## 2017-03-11 LAB — D-DIMER, QUANTITATIVE (NOT AT ARMC)

## 2017-03-11 LAB — TROPONIN I: Troponin I: <0.03 ng/mL (ref <0.03)

## 2017-03-11 NOTE — Discharge Instructions (Signed)
There is no evidence of heart attack or blood clot in the lungs. You should follow up with the doctor and a cardiologist for a stress test. Do not lift more than 10 pounds until cleared by doctor. Return to the ED if your chest pain becomes exertional, worse with movement, shortness of breath, nausea, sweating or any other concerns.

## 2017-04-04 ENCOUNTER — Encounter: Payer: Self-pay | Admitting: Internal Medicine

## 2017-04-04 ENCOUNTER — Ambulatory Visit: Payer: Self-pay | Attending: Internal Medicine | Admitting: Internal Medicine

## 2017-04-04 VITALS — BP 125/80 | HR 94 | Temp 98.7°F | Resp 16 | Wt 227.8 lb

## 2017-04-04 DIAGNOSIS — F17200 Nicotine dependence, unspecified, uncomplicated: Secondary | ICD-10-CM

## 2017-04-04 DIAGNOSIS — E1165 Type 2 diabetes mellitus with hyperglycemia: Secondary | ICD-10-CM

## 2017-04-04 DIAGNOSIS — IMO0002 Reserved for concepts with insufficient information to code with codable children: Secondary | ICD-10-CM

## 2017-04-04 DIAGNOSIS — E785 Hyperlipidemia, unspecified: Secondary | ICD-10-CM | POA: Insufficient documentation

## 2017-04-04 DIAGNOSIS — F200 Paranoid schizophrenia: Secondary | ICD-10-CM

## 2017-04-04 DIAGNOSIS — Z794 Long term (current) use of insulin: Secondary | ICD-10-CM

## 2017-04-04 DIAGNOSIS — Z1159 Encounter for screening for other viral diseases: Secondary | ICD-10-CM

## 2017-04-04 DIAGNOSIS — E1065 Type 1 diabetes mellitus with hyperglycemia: Secondary | ICD-10-CM | POA: Insufficient documentation

## 2017-04-04 DIAGNOSIS — F1010 Alcohol abuse, uncomplicated: Secondary | ICD-10-CM | POA: Insufficient documentation

## 2017-04-04 DIAGNOSIS — F1099 Alcohol use, unspecified with unspecified alcohol-induced disorder: Secondary | ICD-10-CM

## 2017-04-04 DIAGNOSIS — F1210 Cannabis abuse, uncomplicated: Secondary | ICD-10-CM

## 2017-04-04 DIAGNOSIS — F17210 Nicotine dependence, cigarettes, uncomplicated: Secondary | ICD-10-CM | POA: Insufficient documentation

## 2017-04-04 DIAGNOSIS — Z114 Encounter for screening for human immunodeficiency virus [HIV]: Secondary | ICD-10-CM

## 2017-04-04 DIAGNOSIS — IMO0001 Reserved for inherently not codable concepts without codable children: Secondary | ICD-10-CM

## 2017-04-04 DIAGNOSIS — F1090 Alcohol use, unspecified, uncomplicated: Secondary | ICD-10-CM | POA: Insufficient documentation

## 2017-04-04 DIAGNOSIS — I10 Essential (primary) hypertension: Secondary | ICD-10-CM | POA: Insufficient documentation

## 2017-04-04 DIAGNOSIS — Z7982 Long term (current) use of aspirin: Secondary | ICD-10-CM | POA: Insufficient documentation

## 2017-04-04 LAB — GLUCOSE, POCT (MANUAL RESULT ENTRY)
POC GLUCOSE: 428 mg/dL — AB (ref 70–99)
POC Glucose: 316 mg/dl — AB (ref 70–99)

## 2017-04-04 LAB — POCT GLYCOSYLATED HEMOGLOBIN (HGB A1C): Hemoglobin A1C: 10.4

## 2017-04-04 LAB — POCT URINALYSIS DIPSTICK
Bilirubin, UA: NEGATIVE
Glucose, UA: 500
Ketones, UA: NEGATIVE
NITRITE UA: NEGATIVE
PROTEIN UA: NEGATIVE
Spec Grav, UA: 1.01 (ref 1.010–1.025)
UROBILINOGEN UA: 0.2 U/dL
pH, UA: 5.5 (ref 5.0–8.0)

## 2017-04-04 MED ORDER — INSULIN ASPART 100 UNIT/ML ~~LOC~~ SOLN: SUBCUTANEOUS | 11 refills | Status: DC

## 2017-04-04 MED ORDER — "INSULIN SYRINGE-NEEDLE U-100 30G X 1/2"" 0.3 ML MISC": 5 refills | Status: AC

## 2017-04-04 MED ORDER — INSULIN GLARGINE 100 UNIT/ML SOLOSTAR PEN: 16.0000 [IU] | PEN_INJECTOR | Freq: Every day | SUBCUTANEOUS | 99 refills | Status: DC

## 2017-04-04 MED ORDER — ATORVASTATIN CALCIUM 10 MG PO TABS: 10.0000 mg | ORAL_TABLET | Freq: Every day | ORAL | 3 refills | Status: DC

## 2017-04-04 MED ORDER — INSULIN ASPART 100 UNIT/ML ~~LOC~~ SOLN
20.0000 [IU] | Freq: Once | SUBCUTANEOUS | Status: AC
  Administered 2017-04-04: 20 [IU] via SUBCUTANEOUS

## 2017-04-04 MED ORDER — METFORMIN HCL 500 MG PO TABS: 500.0000 mg | ORAL_TABLET | Freq: Two times a day (BID) | ORAL | 3 refills | Status: DC

## 2017-04-04 MED ORDER — PEN NEEDLES 32G X 5 MM MISC: 1.0000 | Freq: Every day | 6 refills | Status: DC

## 2017-04-04 MED ORDER — ASPIRIN EC 81 MG PO TBEC: 81.0000 mg | DELAYED_RELEASE_TABLET | Freq: Every day | ORAL | 1 refills | Status: AC

## 2017-04-04 MED FILL — !NOVOLOG 100UNITS/ML VIAL: 100/ML | 28 days supply | Qty: 10 | Fill #0

## 2017-04-04 MED FILL — !LANTUS SOLOSTAR 100UNITS/M: 100 | 18 days supply | Qty: 3 | Fill #0

## 2017-04-04 MED FILL — TRUEPLUS SYR 0.5ML 30GX5/16: 30G X 5/16" | 25 days supply | Qty: 100 | Fill #0

## 2017-04-04 MED FILL — TRUEPLUS PEN NDL 31GX3/16": 31G X 5 MM | 25 days supply | Qty: 100 | Fill #0

## 2017-04-04 MED FILL — ATORVASTATIN 10 MG TABLET: 10 | 30 days supply | Qty: 30 | Fill #0

## 2017-04-04 MED FILL — TRUEPLUS PEN NDL 31GX3/16: 31G X 5 MM | 25 days supply | Qty: 100 | Fill #0

## 2017-04-04 MED FILL — ?METFORMIN HCL 500MG TABLET: 500 | 30 days supply | Qty: 60 | Fill #0

## 2017-04-04 NOTE — Progress Notes (Signed)
Patient ID: Connor Cook, male    DOB: 1981/10/22  MRN: [id number]  CC: Follow-up and Establish Care   Subjective: Connor Cook is a 36 y.o. male who presents as the ER follow-up and to establish care.  Mom, Aram Beecham, is with him. His concerns today include:  Patient with history of diabetes type 1, hypertension, tobacco dependence, and hyperlipidemia. Patient seen in the emergency room 03/10/2017 with complaint of intermittent chest pains when lifting boxes at work 2 days. EKG revealed no acute ischemic changes. Cardiac enzymes were negative 2. Patient discharged and told to follow-up with her primary provider.  He reports no further CP since that ER visit.  1.  DM: dx at age 83 -off meds x 1 yr or more.  "I forget to take it." -denies blurred vision, poly or numbness -wgh goes up and down.  Good appetite.  Eats a lot of junk foods -walks in his neighborhood 2-3 x a day for 20 mins  2. Tobacco Dep:  Smokes 1 pk/day since age 12. -quit for few mths in past -not wanting to quit "may be one day."  3.  Hx of paranoid schizophrenia.  Recently had a mental relapse  Followed by [facility] -last saw them last wk.  On Invaga once a mth.  Just restarted last wk -smokes weed daily. -was drinking two 40 oz a day. States he is trying to "stay off the beer." last drank 2 days ago -currently lives with his mom.  He was staying with girlfriend prior to his mental relapse    Patient Active Problem List   Diagnosis Date Noted  . Redness of eye, right 07/26/2011  . TOBACCO ABUSE 05/07/2010  . 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 ACIDOSIS 08/21/2008  . DYSLIPIDEMIA 08/15/2008  . SINUS TACHYCARDIA 12/13/2007  . HYPERTENSION 11/21/2007  . ASPIRATION PNEUMONIA 11/14/2007  . PYURIA 11/14/2007  . PERSONAL HISTORY OF AFFECTIVE DISORDER 11/14/2007  . DIABETES MELLITUS, TYPE I 11/13/2007  . PANCREATITIS, HX OF 11/13/2007  . GASTROINTESTINAL HEMORRHAGE, HX OF 11/13/2007  . RENAL FAILURE, ACUTE,  HX OF 11/13/2007     No current outpatient prescriptions on file prior to visit.   No current facility-administered medications on file prior to visit.     No Known Allergies  Social History   Social History  . Marital status: Single    Spouse name: N/A  . Number of children: N/A  . Years of education: N/A   Occupational History  . Not on file.   Social History Main Topics  . Smoking status: Current Every Day Smoker    Packs/day: 1.00    Years: 3.00  . Smokeless tobacco: Never Used  . Alcohol use Yes     Comment: two 40 oz daily  . Drug use: No  . Sexual activity: Not on file     Comment: quit few months   Other Topics Concern  . Not on file   Social History Narrative   Lives with mom in [place]. Has a girlfiend. Has 1 brother and 2 sisters. No children.    Family History  Problem Relation Age of Onset  . Heart disease Father   . Diabetes Father   . Hypertension Mother   . Diabetes Mother   . Diabetes Maternal Aunt   . Diabetes Maternal Uncle   . Mental illness Paternal Uncle   . Diabetes Maternal Grandmother     History reviewed. No pertinent surgical history.  ROS: Review of Systems  Constitutional: Negative for activity  change and fatigue.  Eyes: Negative for visual disturbance.  Endocrine: Negative for polydipsia and polyuria.  Genitourinary: Negative for difficulty urinating.  Neurological: Negative for dizziness and headaches.    PHYSICAL EXAM: BP 125/80   Pulse 94   Temp 98.7 F (37.1 C) (Oral)   Resp 16   Wt 227 lb 12.8 oz (103.3 kg)   SpO2 98%   BMI 30.90 kg/m   Wt Readings from Last 3 Encounters:  04/04/17 227 lb 12.8 oz (103.3 kg)  03/10/17 230 lb (104.3 kg)  12/28/11 267 lb 4.8 oz (121.2 kg)    Physical Exam  General appearance - alert, milddle age AAM in NAD Mental status - flat affect Eyes - pink conjunctiva Mouth - mucous membranes moist, pharynx normal without lesions Neck - supple, no significant adenopathy Chest -  clear to auscultation, no wheezes, rales or rhonchi, symmetric air entry Heart - normal rate, regular rhythm, normal S1, S2, no murmurs, rubs, clicks or gallops Extremities - peripheral pulses normal, no pedal edema, no clubbing or cyanosis Diabetic Foot Exam - Simple   Simple Foot Form Visual Inspection See comments:  Yes Sensation Testing Intact to touch and monofilament testing bilaterally:  Yes Pulse Check Posterior Tibialis and Dorsalis pulse intact bilaterally:  Yes Comments Hard, cracked callous on medial aspect of both heels.        Depression screen [facility] 2/9 04/04/2017  Decreased Interest 0  Down, Depressed, Hopeless 2  PHQ - 2 Score 2  Altered sleeping 1  Tired, decreased energy 0  Change in appetite 0  Feeling bad or failure about yourself  1  Trouble concentrating 1  Moving slowly or fidgety/restless 0  Suicidal thoughts 1  PHQ-9 Score 6   GAD 7 : Generalized Anxiety Score 04/04/2017  Nervous, Anxious, on Edge 0  Control/stop worrying 0  Worry too much - different things 1  Trouble relaxing 0  Restless 0  Easily annoyed or irritable 1  Afraid - awful might happen 0  Total GAD 7 Score 2   ASSESSMENT AND PLAN: 1. Uncontrolled type 2 diabetes mellitus without complication, with long-term current use of insulin (HCC) -I suspect patient is type II diabetic given that he has been off of insulin for over a year with no DKA episodes. Discussed the importance of healthy eating habits, regular aerobic exercise (at least 150 minutes a week as tolerated) and medication compliance to achieve or maintain control of diabetes. -Given mental illness I will try to simplify indication regiment. Start him on Lantus 16 units at bedtime and NovoLog 8 units with the largest meal of the day. Metformin 500 mg twice a day. -Mother plans to purchase a glucometer and strips at [facility]. I have asked him to check blood sugars twice a day and bring in readings in 2 weeks when he sees the  clinical pharmacist for review. -Patient given 20 units of NovoLog subcutaneous today. -Opthalmology referral - POCT glucose (manual entry) - POCT glycosylated hemoglobin (Hb A1C) - POCT urinalysis dipstick - Microalbumin/Creatinine Ratio, Urine - insulin aspart (novoLOG) injection 20 Units; Inject 0.2 mLs (20 Units total) into the skin once. - Insulin Glargine (LANTUS SOLOSTAR) 100 UNIT/ML Solostar Pen; Inject 16 Units into the skin daily at 10 pm.  Dispense: 5 pen; Refill: PRN - Insulin Pen Needle (PEN NEEDLES) 32G X 5 MM MISC; 1 Bottle by Does not apply route daily.  Dispense: 100 each; Refill: 6 - metFORMIN (GLUCOPHAGE) 500 MG tablet; Take 1 tablet (500 mg total)  by mouth 2 (two) times daily with a meal.  Dispense: 180 tablet; Refill: 3 - insulin aspart (NOVOLOG) 100 UNIT/ML injection; 8 units subcut daily with dinner  Dispense: 10 mL; Refill: 11 - Insulin Syringe-Needle U-100 (GLOBAL INSULIN SYRINGES) 30G X 1/2" 0.3 ML MISC; Use as directed  Dispense: 100 each; Refill: 5 - atorvastatin (LIPITOR) 10 MG tablet; Take 1 tablet (10 mg total) by mouth daily.  Dispense: 90 tablet; Refill: 3 - aspirin EC 81 MG tablet; Take 1 tablet (81 mg total) by mouth daily.  Dispense: 100 tablet; Refill: 1 - Comprehensive metabolic panel - Lipid panel - POCT glucose (manual entry)  2. Tobacco dependence Patient advised to quit smoking. Discussed health risks associated with smoking including lung and other types of cancers, chronic lung diseases and CV risks.. Pt /not ready to give trail of quitting.  Discussed methods to help quit including quitting cold Malawiturkey, use of NRT, Chantix and Bupropion.   3. Paranoid schizophrenia (HCC) Followed by [facility].  4. Marijuana abuse, continuous -Discourage use  5. Alcohol use disorder [facility](HCC) Encourage him to cut back. Patient states that he has not drank in 2 days and it tends to let it go  6. Screening for HIV (human immunodeficiency virus) - HIV  antibody  7. Need for hepatitis C screening test - Lipid panel - Hepatitis C Antibody  Results for orders placed or performed in visit on 04/04/17  POCT glucose (manual entry)  Result Value Ref Range   POC Glucose 428 (A) 70 - 99 mg/dl  POCT glycosylated hemoglobin (Hb A1C)  Result Value Ref Range   Hemoglobin A1C 10.4   POCT urinalysis dipstick  Result Value Ref Range   Color, UA yellow    Clarity, UA clear    Glucose, UA 500    Bilirubin, UA negative    Ketones, UA negative    Spec Grav, UA 1.010 1.010 - 1.025   Blood, UA trace    pH, UA 5.5 5.0 - 8.0   Protein, UA negative    Urobilinogen, UA 0.2 0.2 or 1.0 E.U./dL   Nitrite, UA negative    Leukocytes, UA Trace (A) Negative  POCT glucose (manual entry)  Result Value Ref Range   POC Glucose 316 (A) 70 - 99 mg/dl    Patient was given the opportunity to ask questions.  Patient verbalized understanding of the plan and was able to repeat key elements of the plan.   Orders Placed This Encounter  Procedures  . Microalbumin/Creatinine Ratio, Urine  . Comprehensive metabolic panel  . Lipid panel  . HIV antibody  . Hepatitis C Antibody  . POCT glucose (manual entry)  . POCT glycosylated hemoglobin (Hb A1C)  . POCT urinalysis dipstick  . POCT glucose (manual entry)     Requested Prescriptions   Signed Prescriptions Disp Refills  . Insulin Glargine (LANTUS SOLOSTAR) 100 UNIT/ML Solostar Pen 5 pen PRN    Sig: Inject 16 Units into the skin daily at 10 pm.  . Insulin Pen Needle (PEN NEEDLES) 32G X 5 MM MISC 100 each 6    Sig: 1 Bottle by Does not apply route daily.  . metFORMIN (GLUCOPHAGE) 500 MG tablet 180 tablet 3    Sig: Take 1 tablet (500 mg total) by mouth 2 (two) times daily with a meal.  . insulin aspart (NOVOLOG) 100 UNIT/ML injection 10 mL 11    Sig: 8 units subcut daily with dinner  . Insulin Syringe-Needle U-100 (GLOBAL INSULIN SYRINGES) 30G X  1/2" 0.3 ML MISC 100 each 5    Sig: Use as directed  .  atorvastatin (LIPITOR) 10 MG tablet 90 tablet 3    Sig: Take 1 tablet (10 mg total) by mouth daily.  Marland Kitchen aspirin EC 81 MG tablet 100 tablet 1    Sig: Take 1 tablet (81 mg total) by mouth daily.    Return in about 6 weeks (around 05/16/2017).  Jonah Blue, MD, FACP

## 2017-04-04 NOTE — Progress Notes (Signed)
Insulin administration education provided. Patient was able to demonstrate use of insulin pen as well as insulin syringe/vial.

## 2017-04-04 NOTE — Patient Instructions (Addendum)
  Give appointment with Ms [facility] Give appointment with Kennyth ArnoldStacy in 2 weeks for recheck of blood sugars.   Start Lantus insulin 16 units at bedtime. Take NovoLog 8 units with dinner. Start metformin 500 mg twice a day. Check your blood sugars twice a day before meals and bring in readings on next visit.  Follow a Healthy Eating Plan - You can do it! Limit sugary drinks.  Avoid sodas, sweet tea, sport or energy drinks, or fruit drinks.  Drink water, lo-fat milk, or diet drinks. Limit snack foods.   Cut back on candy, cake, cookies, chips, ice cream.  These are a special treat, only in small amounts. Eat plenty of vegetables.  Especially dark green, red, and orange vegetables. Aim for at least 3 servings a day. More is better! Include fruit in your daily diet.  Whole fruit is much healthier than fruit juice! Limit "white" bread, "white" pasta, "white" rice.   Choose "100% whole grain" products, brown or wild rice. Avoid fatty meats. Try "Meatless Monday" and choose eggs or beans one day a week.  When eating meat, choose lean meats like chicken, Malawiturkey, and fish.  Grill, broil, or bake meats instead of frying, and eat poultry without the skin. Eat less salt.  Avoid frozen pizzas, frozen dinners and salty foods.  Use seasonings other than salt in cooking.  This can help blood pressure and keep you from swelling Beer, wine and liquor have calories.  If you can safely drink alcohol, limit to 1 drink per day for women, 2 drinks for men

## 2017-04-05 LAB — LIPID PANEL
CHOL/HDL RATIO: 3.3 ratio (ref 0.0–5.0)
Cholesterol, Total: 181 mg/dL (ref 100–199)
HDL: 55 mg/dL (ref >39)
LDL CALC: 114 mg/dL — AB (ref 0–99)
TRIGLYCERIDES: 59 mg/dL (ref 0–149)
VLDL Cholesterol Cal: 12 mg/dL (ref 5–40)

## 2017-04-05 LAB — COMPREHENSIVE METABOLIC PANEL
A/G RATIO: 1.9 (ref 1.2–2.2)
ALT: 27 IU/L (ref 0–44)
AST: 20 IU/L (ref 0–40)
Albumin: 4.2 g/dL (ref 3.5–5.5)
Alkaline Phosphatase: 75 IU/L (ref 39–117)
BUN/Creatinine Ratio: 13 (ref 9–20)
BUN: 14 mg/dL (ref 6–20)
Bilirubin Total: 0.4 mg/dL (ref 0.0–1.2)
CO2: 24 mmol/L (ref 20–29)
CREATININE: 1.08 mg/dL (ref 0.76–1.27)
Calcium: 9.6 mg/dL (ref 8.7–10.2)
Chloride: 100 mmol/L (ref 96–106)
GFR, EST AFRICAN AMERICAN: 101 mL/min/{1.73_m2} (ref >59)
GFR, EST NON AFRICAN AMERICAN: 87 mL/min/{1.73_m2} (ref >59)
GLOBULIN, TOTAL: 2.2 g/dL (ref 1.5–4.5)
Glucose: 187 mg/dL — ABNORMAL HIGH (ref 65–99)
POTASSIUM: 3.9 mmol/L (ref 3.5–5.2)
SODIUM: 140 mmol/L (ref 134–144)
Total Protein: 6.4 g/dL (ref 6.0–8.5)

## 2017-04-05 LAB — MICROALBUMIN / CREATININE URINE RATIO
Creatinine, Urine: 52.4 mg/dL
Microalb/Creat Ratio: <5.7 mg/g creat (ref 0.0–30.0)
Microalbumin, Urine: <3 ug/mL

## 2017-04-05 LAB — HEPATITIS C ANTIBODY: Hep C Virus Ab: <0.1 s/co ratio (ref 0.0–0.9)

## 2017-04-05 LAB — HIV ANTIBODY (ROUTINE TESTING W REFLEX): HIV SCREEN 4TH GENERATION: NONREACTIVE

## 2017-04-12 ENCOUNTER — Telehealth: Payer: Self-pay

## 2017-04-12 ENCOUNTER — Ambulatory Visit: Payer: Self-pay

## 2017-04-12 NOTE — Telephone Encounter (Signed)
Contacted pt to go over lab results pt didn't answer lvm asking pt to give me a call back at his earliest convenience   If pt calls back please give results: kidney and liver function normal. Cholesterol mildly elevated. We started him on Lipitor for this. HIV and Hep C screens negative.

## 2017-04-18 ENCOUNTER — Ambulatory Visit: Payer: Self-pay | Admitting: Pharmacist

## 2017-04-18 ENCOUNTER — Emergency Department (HOSPITAL_COMMUNITY): Admission: EM | Admit: 2017-04-18 | Discharge: 2017-04-18 | Discharge status: Against Medical Advice | Payer: Self-pay

## 2017-04-18 NOTE — ED Notes (Signed)
Called pt's name a second time, but still no response.

## 2017-04-18 NOTE — ED Notes (Addendum)
Called pt's name to be triaged, but no response from the waiting room. 

## 2017-04-18 NOTE — Progress Notes (Deleted)
    S:     No chief complaint on file.   Patient arrives ***.  Presents for diabetes evaluation, education, and management at the request of Dr. Laural BenesJohnson. Patient was referred on 04/04/17.  Patient was last seen by Primary Care Provider on 04/04/17.   Patient reports Diabetes was diagnosed about 8 years ago. He was reportedly diagnosed as type 1 but he has been off insulin for over a year so he has been reclassified as type 2 per Dr. Laural BenesJohnson.   Patient {Actions; denies-reports:120008} adherence with medications.  Current diabetes medications include: Lantus 16 units daily, Novolog 8 units with biggest meal of the day, and metformin 500 mg BID.   Patient {Actions; denies-reports:120008} hypoglycemic events.  Patient reported dietary habits: Eats *** meals/day Breakfast:*** Lunch:*** Dinner:*** Snacks:*** Drinks:***  Patient reported exercise habits:    Patient {Actions; denies-reports:120008} nocturia.  Patient {Actions; denies-reports:120008} neuropathy. Patient {Actions; denies-reports:120008} visual changes. Patient {Actions; denies-reports:120008} self foot exams.   [email or web address]   O:  Physical Exam   ROS   Lab Results  Component Value Date   HGBA1C 10.4 04/04/2017   There were no vitals filed for this visit.  Home fasting CBG: ***  2 hour post-prandial/random CBG: ***.  10 year ASCVD risk: ***.  A/P: Diabetes longstanding/newly diagnosed currently ***. Patient {Actions; denies-reports:120008} hypoglycemic events and is able to verbalize appropriate hypoglycemia management plan. Patient {Actions; denies-reports:120008} adherence with medication. Control is suboptimal due to ***.  PICK 1 OF THE FOLLOWING 4 (DELETE the others AND THEN DELETE THIS LINE OF TEXT)  {Meds adjust:18428} basal insulin Lantus (insulin glargine). Patient will continue to titrate 1 unit/day if fasting CBGs > 100mg /dl until fasting CBGs reach goal or next visit.    {Meds adjust:18428} basal  insulin Lantus (insulin glargine) to ***. {Meds adjust:18428} rapid insulin Novolog (insulin aspart) to ***.   {Meds adjust:18428} Victoza (liraglutide) to ***.   {Meds adjust:18428} Trulicity (dulaglutide) to ***.   {Meds adjust:18428} Jardiance (empagliflozin) to ***.  Next A1C anticipated ***.    ASCVD risk greater than 7.5%. {Meds adjust:18428} Aspirin *** mg and {Meds adjust:18428} ***statin *** mg.   Hypertension longstanding/newly diagnosed currently ***.  Patient {Actions; denies-reports:120008} adherence with medication. Control is suboptimal due to ***.  Written patient instructions provided.  Total time in face to face counseling *** minutes.   Follow up in Pharmacist Clinic Visit ***.   Patient seen with ***

## 2017-04-19 ENCOUNTER — Emergency Department (HOSPITAL_COMMUNITY)
Admission: EM | Admit: 2017-04-19 | Discharge: 2017-04-23 | Disposition: A | Discharge status: Home / Self Care | Payer: Self-pay | Attending: Emergency Medicine | Admitting: Emergency Medicine

## 2017-04-19 DIAGNOSIS — Z008 Encounter for other general examination: Secondary | ICD-10-CM

## 2017-04-19 DIAGNOSIS — I10 Essential (primary) hypertension: Secondary | ICD-10-CM | POA: Insufficient documentation

## 2017-04-19 DIAGNOSIS — Z79899 Other long term (current) drug therapy: Secondary | ICD-10-CM | POA: Insufficient documentation

## 2017-04-19 DIAGNOSIS — R443 Hallucinations, unspecified: Secondary | ICD-10-CM

## 2017-04-19 DIAGNOSIS — F17200 Nicotine dependence, unspecified, uncomplicated: Secondary | ICD-10-CM | POA: Insufficient documentation

## 2017-04-19 DIAGNOSIS — F200 Paranoid schizophrenia: Secondary | ICD-10-CM | POA: Insufficient documentation

## 2017-04-19 DIAGNOSIS — E119 Type 2 diabetes mellitus without complications: Secondary | ICD-10-CM | POA: Insufficient documentation

## 2017-04-19 DIAGNOSIS — F1210 Cannabis abuse, uncomplicated: Secondary | ICD-10-CM | POA: Diagnosis present

## 2017-04-19 DIAGNOSIS — Z7982 Long term (current) use of aspirin: Secondary | ICD-10-CM | POA: Insufficient documentation

## 2017-04-19 DIAGNOSIS — Z794 Long term (current) use of insulin: Secondary | ICD-10-CM | POA: Insufficient documentation

## 2017-04-19 LAB — RAPID URINE DRUG SCREEN, HOSP PERFORMED
AMPHETAMINES: NOT DETECTED
BENZODIAZEPINES: NOT DETECTED
Barbiturates: NOT DETECTED
COCAINE: NOT DETECTED
Opiates: NOT DETECTED
Tetrahydrocannabinol: POSITIVE — AB

## 2017-04-19 LAB — CBC WITH DIFFERENTIAL/PLATELET
BASOS ABS: 0 10*3/uL (ref 0.0–0.1)
BASOS PCT: 0 %
Eosinophils Absolute: 0.1 10*3/uL (ref 0.0–0.7)
Eosinophils Relative: 1 %
HEMATOCRIT: 46 % (ref 39.0–52.0)
HEMOGLOBIN: 15.9 g/dL (ref 13.0–17.0)
Lymphocytes Relative: 20 %
Lymphs Abs: 1.6 10*3/uL (ref 0.7–4.0)
MCH: 26.4 pg (ref 26.0–34.0)
MCHC: 34.6 g/dL (ref 30.0–36.0)
MCV: 76.4 fL — AB (ref 78.0–100.0)
MONO ABS: 0.6 10*3/uL (ref 0.1–1.0)
Monocytes Relative: 7 %
NEUTROS ABS: 5.8 10*3/uL (ref 1.7–7.7)
NEUTROS PCT: 72 %
Platelets: 264 10*3/uL (ref 150–400)
RBC: 6.02 MIL/uL — AB (ref 4.22–5.81)
RDW: 13.1 % (ref 11.5–15.5)
WBC: 8 10*3/uL (ref 4.0–10.5)

## 2017-04-19 LAB — COMPREHENSIVE METABOLIC PANEL
ALT: 33 U/L (ref 17–63)
ANION GAP: 9 (ref 5–15)
AST: 30 U/L (ref 15–41)
Albumin: 4 g/dL (ref 3.5–5.0)
Alkaline Phosphatase: 76 U/L (ref 38–126)
BUN: 15 mg/dL (ref 6–20)
CO2: 23 mmol/L (ref 22–32)
CREATININE: 1.04 mg/dL (ref 0.61–1.24)
Calcium: 8.9 mg/dL (ref 8.9–10.3)
Chloride: 102 mmol/L (ref 101–111)
GFR calc non Af Amer: >60 mL/min (ref >60)
Glucose, Bld: 332 mg/dL — ABNORMAL HIGH (ref 65–99)
POTASSIUM: 4 mmol/L (ref 3.5–5.1)
SODIUM: 134 mmol/L — AB (ref 135–145)
Total Bilirubin: 0.5 mg/dL (ref 0.3–1.2)
Total Protein: 6.8 g/dL (ref 6.5–8.1)

## 2017-04-19 LAB — ETHANOL

## 2017-04-19 LAB — ACETAMINOPHEN LEVEL: Acetaminophen (Tylenol), Serum: <10 ug/mL — ABNORMAL LOW (ref 10–30)

## 2017-04-19 MED ORDER — ALUM & MAG HYDROXIDE-SIMETH 200-200-20 MG/5ML PO SUSP: 30.0000 mL | Freq: Four times a day (QID) | ORAL | Status: DC | PRN

## 2017-04-19 MED ORDER — ONDANSETRON HCL 4 MG PO TABS: 4.0000 mg | ORAL_TABLET | Freq: Three times a day (TID) | ORAL | Status: DC | PRN

## 2017-04-19 MED ORDER — VITAMIN B-1 100 MG PO TABS
100.0000 mg | ORAL_TABLET | Freq: Every day | ORAL | Status: DC
  Administered 2017-04-20 – 2017-04-23 (×3): 100 mg via ORAL
  Filled 2017-04-19 (×3): qty 1

## 2017-04-19 MED ORDER — METFORMIN HCL 500 MG PO TABS
500.0000 mg | ORAL_TABLET | Freq: Two times a day (BID) | ORAL | Status: DC
  Administered 2017-04-20 – 2017-04-23 (×6): 500 mg via ORAL
  Filled 2017-04-19 (×7): qty 1

## 2017-04-19 MED ORDER — IBUPROFEN 200 MG PO TABS: 600.0000 mg | ORAL_TABLET | Freq: Three times a day (TID) | ORAL | Status: DC | PRN

## 2017-04-19 MED ORDER — ATORVASTATIN CALCIUM 10 MG PO TABS
10.0000 mg | ORAL_TABLET | Freq: Every day | ORAL | Status: DC
  Administered 2017-04-20 – 2017-04-22 (×2): 10 mg via ORAL
  Filled 2017-04-19 (×3): qty 1

## 2017-04-19 MED ORDER — LORAZEPAM 2 MG/ML IJ SOLN: 0.0000 mg | Freq: Four times a day (QID) | INTRAMUSCULAR | Status: AC

## 2017-04-19 MED ORDER — LORAZEPAM 2 MG/ML IJ SOLN: 0.0000 mg | Freq: Two times a day (BID) | INTRAMUSCULAR | Status: DC

## 2017-04-19 MED ORDER — INSULIN ASPART 100 UNIT/ML ~~LOC~~ SOLN
0.0000 [IU] | Freq: Three times a day (TID) | SUBCUTANEOUS | Status: DC
  Administered 2017-04-20 – 2017-04-21 (×4): 5 [IU] via SUBCUTANEOUS
  Administered 2017-04-21: 11 [IU] via SUBCUTANEOUS
  Administered 2017-04-21: 5 [IU] via SUBCUTANEOUS
  Administered 2017-04-22: 3 [IU] via SUBCUTANEOUS
  Administered 2017-04-22: 8 [IU] via SUBCUTANEOUS
  Administered 2017-04-22: 3 [IU] via SUBCUTANEOUS
  Administered 2017-04-23: 8 [IU] via SUBCUTANEOUS
  Filled 2017-04-19 (×7): qty 1

## 2017-04-19 MED ORDER — LORAZEPAM 1 MG PO TABS
0.0000 mg | ORAL_TABLET | Freq: Four times a day (QID) | ORAL | Status: AC
  Administered 2017-04-19: 2 mg via ORAL
  Filled 2017-04-19: qty 2

## 2017-04-19 MED ORDER — ACETAMINOPHEN 325 MG PO TABS: 650.0000 mg | ORAL_TABLET | ORAL | Status: DC | PRN

## 2017-04-19 MED ORDER — THIAMINE HCL 100 MG/ML IJ SOLN: 100.0000 mg | Freq: Every day | INTRAMUSCULAR | Status: DC

## 2017-04-19 MED ORDER — LORAZEPAM 1 MG PO TABS
0.0000 mg | ORAL_TABLET | Freq: Two times a day (BID) | ORAL | Status: DC
  Administered 2017-04-22 – 2017-04-23 (×2): 2 mg via ORAL
  Filled 2017-04-19 (×2): qty 2

## 2017-04-19 MED ORDER — INSULIN GLARGINE 100 UNIT/ML ~~LOC~~ SOLN
16.0000 [IU] | Freq: Every day | SUBCUTANEOUS | Status: DC
  Administered 2017-04-19 – 2017-04-22 (×4): 16 [IU] via SUBCUTANEOUS
  Filled 2017-04-19 (×4): qty 0.16

## 2017-04-19 MED ORDER — ASPIRIN EC 81 MG PO TBEC
81.0000 mg | DELAYED_RELEASE_TABLET | Freq: Every day | ORAL | Status: DC
  Administered 2017-04-20 – 2017-04-23 (×4): 81 mg via ORAL
  Filled 2017-04-19 (×4): qty 1

## 2017-04-19 MED ORDER — NICOTINE 21 MG/24HR TD PT24
21.0000 mg | MEDICATED_PATCH | Freq: Every day | TRANSDERMAL | Status: DC
  Administered 2017-04-19 – 2017-04-23 (×4): 21 mg via TRANSDERMAL
  Filled 2017-04-19 (×4): qty 1
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
## 2017-04-19 NOTE — ED Provider Notes (Signed)
Emergency Department Provider Note   I have reviewed the triage vital signs and the nursing notes.   HISTORY  Chief Complaint Medical Clearance   HPI Connor Cook is a 36 y.o. male with PMH of DM and schizophrenia presents to the emergency department for evaluation of possible visual hallucinations and threatening behavior. He was IVCd by his mother who reported in the IVC paperwork that he has been seeing shadows and carrying around a large assortment of knives. CT currently denies any suicidal or homicidal ideation. He denies any auditory or visual hallucinations. No medical complaints at this time. Patient states that he is taking Invega for his schizophrenia. He reports occasional marijuana use and occasional drinking alcohol.    Past Medical History:  Diagnosis Date  . Aspiration pneumonia (HCC)    History of  . Diabetes mellitus dx dec 2008   ,type1 , dx in 2008 with an episode of DKA  . H/O: upper GI bleed   . History of acute pancreatitis   . History of acute renal failure   . Psychosis    receive fluhenazine injections every 2 weeks at [facility]: contact is Merryl Hackerebbie Knox, labeled as Psychotic disorder NOS.    Patient Active Problem List   Diagnosis Date Noted  . Paranoid schizophrenia (HCC) 04/04/2017  . Marijuana abuse, continuous 04/04/2017  . Alcohol use disorder (HCC) 04/04/2017  . Redness of eye, right 07/26/2011  . TOBACCO ABUSE 05/07/2010  . 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 ACIDOSIS 08/21/2008  . DYSLIPIDEMIA 08/15/2008  . SINUS TACHYCARDIA 12/13/2007  . HYPERTENSION 11/21/2007  . ASPIRATION PNEUMONIA 11/14/2007  . PYURIA 11/14/2007  . PERSONAL HISTORY OF AFFECTIVE DISORDER 11/14/2007  . DIABETES MELLITUS, TYPE I 11/13/2007  . PANCREATITIS, HX OF 11/13/2007  . GASTROINTESTINAL HEMORRHAGE, HX OF 11/13/2007  . RENAL FAILURE, ACUTE, HX OF 11/13/2007    No past surgical history on file.  Current Outpatient Rx  . Order #:  [id number] Class: Normal  . Order #: [id number] Class: Normal  . Order #: [id number] Class: Normal  . Order #: [id number] Class: Normal  . Order #: [id number] Class: Normal  . Order #: [id number] Class: Normal  . Order #: [id number] Class: Normal    Allergies Patient has no known allergies.  Family History  Problem Relation Age of Onset  . Heart disease Father   . Diabetes Father   . Hypertension Mother   . Diabetes Mother   . Diabetes Maternal Aunt   . Diabetes Maternal Uncle   . Mental illness Paternal Uncle   . Diabetes Maternal Grandmother     Social History Social History  Substance Use Topics  . Smoking status: Current Every Day Smoker    Packs/day: 1.00    Years: 3.00  . Smokeless tobacco: Never Used  . Alcohol use Yes     Comment: two 40 oz daily    Review of Systems  Constitutional: No fever/chills Eyes: No visual changes. ENT: No sore throat. Cardiovascular: Denies chest pain. Respiratory: Denies shortness of breath. Gastrointestinal: No abdominal pain.  No nausea, no vomiting.  No diarrhea.  No constipation. Genitourinary: Negative for dysuria. Musculoskeletal: Negative for back pain. Skin: Negative for rash. Neurological: Negative for headaches, focal weakness or numbness. Psychiatric: Denies SI/HI 10-point ROS otherwise negative.  ____________________________________________   PHYSICAL EXAM:  VITAL SIGNS: ED Triage Vitals  Enc Vitals Group     BP 04/19/17 2008 130/87     Pulse Rate 04/19/17 2008 (!) 118     Resp 04/19/17 2008 18  Temp 04/19/17 2008 98.3 F (36.8 C)     Temp Source 04/19/17 2008 Oral     SpO2 04/19/17 2008 97 %     Pain Score 04/19/17 2023 0   Constitutional: Alert and oriented. Speaking in very low voice.  Eyes: Conjunctivae are normal.  Head: Atraumatic. Nose: No congestion/rhinnorhea. Mouth/Throat: Mucous membranes are moist.  Oropharynx non-erythematous. Neck: No stridor.  Cardiovascular: Tachycardia. Good peripheral  circulation. Grossly normal heart sounds.   Respiratory: Normal respiratory effort.  No retractions. Lungs CTAB. Gastrointestinal: Soft and nontender. No distention.  Musculoskeletal: No lower extremity tenderness nor edema. No gross deformities of extremities. Neurologic:  Normal speech and language. No gross focal neurologic deficits are appreciated.  Skin:  Skin is warm, dry and intact. No rash noted. Psychiatric: Mood and affect are flat. Patient speaking in very low voice. Not responding to internal stimuli but though process is somewhat tangential. Speech is not pressured.   ____________________________________________   LABS (all labs ordered are listed, but only abnormal results are displayed)  Labs Reviewed  COMPREHENSIVE METABOLIC PANEL - Abnormal; Notable for the following:       Result Value   Sodium 134 (*)    Glucose, Bld 332 (*)    All other components within normal limits  ACETAMINOPHEN LEVEL - Abnormal; Notable for the following:    Acetaminophen (Tylenol), Serum <10 (*)    All other components within normal limits  RAPID URINE DRUG SCREEN, HOSP PERFORMED - Abnormal; Notable for the following:    Tetrahydrocannabinol POSITIVE (*)    All other components within normal limits  ETHANOL  CBC WITH DIFFERENTIAL/PLATELET   ____________________________________________   PROCEDURES  Procedure(s) performed:   Procedures  None ____________________________________________   INITIAL IMPRESSION / ASSESSMENT AND PLAN / ED COURSE  Pertinent labs & imaging results that were available during my care of the patient were reviewed by me and considered in my medical decision making (see chart for details).  Patient resents to the emergency department for evaluation of threatening behavior and reportedly seeing shadows. He has a history of schizophrenia. Patient is speaking in a very low tone of voice and his thought processes seem slightly scattered. I will complete the 1st  exam IVC paperwork to uphold the IVC.   IVC 1st exam paperwork completed. Labs reviewed. Patient is medically cleared for psychiatry evaluation at this time. Will re-start home medication including lantus 8 Units at night and SSI. Patient have hyperglycemia here in the ED but no evidence of DKA. Patient also endorses some EtOH use so initiated CIWA protocol. Patient is awake, alert, and cooperative at this time.   ____________________________________________  FINAL CLINICAL IMPRESSION(S) / ED DIAGNOSES  Final diagnoses:  Medical clearance for psychiatric admission  Hallucinations     MEDICATIONS GIVEN DURING THIS VISIT:  Medications  nicotine (NICODERM CQ - dosed in mg/24 hours) patch 21 mg (not administered)  acetaminophen (TYLENOL) tablet 650 mg (not administered)  alum & mag hydroxide-simeth (MAALOX/MYLANTA) 200-200-20 MG/5ML suspension 30 mL (not administered)  ibuprofen (ADVIL,MOTRIN) tablet 600 mg (not administered)  ondansetron (ZOFRAN) tablet 4 mg (not administered)  LORazepam (ATIVAN) injection 0-4 mg (not administered)    Or  LORazepam (ATIVAN) tablet 0-4 mg (not administered)  LORazepam (ATIVAN) injection 0-4 mg (not administered)    Or  LORazepam (ATIVAN) tablet 0-4 mg (not administered)  thiamine (VITAMIN B-1) tablet 100 mg (not administered)    Or  thiamine (B-1) injection 100 mg (not administered)  aspirin EC tablet 81 mg (not  administered)  atorvastatin (LIPITOR) tablet 10 mg (not administered)  insulin glargine (LANTUS) injection 16 Units (not administered)  metFORMIN (GLUCOPHAGE) tablet 500 mg (not administered)  insulin aspart (novoLOG) injection 0-15 Units (not administered)     NEW OUTPATIENT MEDICATIONS STARTED DURING THIS VISIT:  None   Note:  This document was prepared using Dragon voice recognition software and may include unintentional dictation errors.  Alona Bene, MD Emergency Medicine   Arielys Wandersee, Arlyss Repress, MD 04/19/17 [id number]

## 2017-04-20 DIAGNOSIS — Z818 Family history of other mental and behavioral disorders: Secondary | ICD-10-CM

## 2017-04-20 DIAGNOSIS — R443 Hallucinations, unspecified: Secondary | ICD-10-CM | POA: Insufficient documentation

## 2017-04-20 LAB — CBG MONITORING, ED
GLUCOSE-CAPILLARY: 256 mg/dL — AB (ref 65–99)
GLUCOSE-CAPILLARY: 246 mg/dL — AB (ref 65–99)
Glucose-Capillary: 279 mg/dL — ABNORMAL HIGH (ref 65–99)
Glucose-Capillary: 221 mg/dL — ABNORMAL HIGH (ref 65–99)
Glucose-Capillary: 232 mg/dL — ABNORMAL HIGH (ref 65–99)
Glucose-Capillary: 225 mg/dL — ABNORMAL HIGH (ref 65–99)

## 2017-04-20 MED ORDER — LORAZEPAM 2 MG/ML IJ SOLN
2.0000 mg | Freq: Once | INTRAMUSCULAR | Status: AC
  Administered 2017-04-20: 2 mg via INTRAMUSCULAR
  Filled 2017-04-20: qty 1

## 2017-04-20 MED ORDER — CHLORPROMAZINE HCL 25 MG/ML IJ SOLN
50.0000 mg | Freq: Once | INTRAMUSCULAR | Status: AC
  Administered 2017-04-20: 50 mg via INTRAMUSCULAR
  Filled 2017-04-20: qty 2

## 2017-04-20 MED ORDER — GABAPENTIN 100 MG PO CAPS
200.0000 mg | ORAL_CAPSULE | Freq: Two times a day (BID) | ORAL | Status: DC
  Administered 2017-04-20 – 2017-04-23 (×6): 200 mg via ORAL
  Filled 2017-04-20 (×6): qty 2

## 2017-04-20 MED ORDER — DIPHENHYDRAMINE HCL 50 MG/ML IJ SOLN
50.0000 mg | Freq: Once | INTRAMUSCULAR | Status: AC
  Administered 2017-04-20: 50 mg via INTRAMUSCULAR
  Filled 2017-04-20: qty 1

## 2017-04-20 MED ORDER — PALIPERIDONE ER 6 MG PO TB24
6.0000 mg | ORAL_TABLET | Freq: Every day | ORAL | Status: DC
  Administered 2017-04-21 – 2017-04-22 (×2): 6 mg via ORAL
  Filled 2017-04-20 (×3): qty 1

## 2017-04-20 MED ORDER — HYDROXYZINE HCL 25 MG PO TABS
25.0000 mg | ORAL_TABLET | Freq: Three times a day (TID) | ORAL | Status: DC
  Administered 2017-04-20 – 2017-04-23 (×7): 25 mg via ORAL
  Filled 2017-04-20 (×8): qty 1
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
## 2017-04-20 NOTE — Progress Notes (Signed)
Inpatient Diabetes Program Recommendations  AACE/ADA: New Consensus Statement on Inpatient Glycemic Control (2015)  Target Ranges:  Prepandial:   less than 140 mg/dL      Peak postprandial:   less than 180 mg/dL (1-2 hours)      Critically ill patients:  140 - 180 mg/dL   Lab Results  Component Value Date   GLUCAP 221 (H) 04/20/2017   HGBA1C 10.4 04/04/2017    Review of Glycemic Control  Diabetes history: DM1 Outpatient Diabetes medications: Lantus 16 units QHS, Novolog 8 units Q supper, metformin 500 mg bid Current orders for Inpatient glycemic control: Lantus 16 units QHS, Novolog 0-15 units tidwc, metformin 500 mg bid  HgbA1C of 10.4% indicates poor glycemic control prior to admission.  Inpatient Diabetes Program Recommendations:    Change diet to CHO mod diet Add HS correction If blood sugars > 180 mg/dL, add Novolog 4 units tidwc for meal coverage insulin  [facility] Will follow.  Thank you. Ailene Ardshonda Monserath Neff, RD, LDN, CDE Inpatient Diabetes Coordinator [phone]

## 2017-04-20 NOTE — ED Notes (Signed)
Pt was getting angry insisting on leaving to smoke a cigarette.  He states "I just want to be left alone, I have demons in me and there is nothing yall can do"  Pt took his shirt back off was jumping and hitting signs in the department.

## 2017-04-20 NOTE — Progress Notes (Signed)
04/20/17 1335:  LRT was informed by staff to let pt remain sleep.  Caroll RancherMarjette Ariela Mochizuki, LRT/CTRS

## 2017-04-20 NOTE — ED Notes (Signed)
Patient is pacing and showing his muscles.  He has taken his shirt of and had to be redirected.  He is glaring into nurses station and shows know interest being assessed .He is air punching and posturing.

## 2017-04-20 NOTE — BH Assessment (Signed)
[facility] Assessment Progress Note  Per Thedore MinsMojeed Akintayo, MD, this pt requires psychiatric hospitalization at this time.  The following facilities have been contacted to seek placement for this pt, with results as noted:  Beds available, information sent, decision pending[facility]   [facility]  [facility]   Sheranda Seabrooks, [place] Triage Specialist [phone]

## 2017-04-20 NOTE — ED Notes (Signed)
Pt sleeping at present, no distress noted, calm & cooperative at present.  Awake, alert & responsive.  Monitoring for safety, Q 15 min checks in effect.
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
## 2017-04-21 LAB — CBG MONITORING, ED
GLUCOSE-CAPILLARY: 232 mg/dL — AB (ref 65–99)
GLUCOSE-CAPILLARY: 176 mg/dL — AB (ref 65–99)
Glucose-Capillary: 310 mg/dL — ABNORMAL HIGH (ref 65–99)
Glucose-Capillary: 228 mg/dL — ABNORMAL HIGH (ref 65–99)

## 2017-04-21 MED ORDER — LORAZEPAM 2 MG/ML IJ SOLN
1.0000 mg | Freq: Once | INTRAMUSCULAR | Status: AC
  Administered 2017-04-21: 1 mg via INTRAMUSCULAR
  Filled 2017-04-21: qty 1

## 2017-04-21 MED ORDER — DIPHENHYDRAMINE HCL 25 MG PO CAPS: 50.0000 mg | ORAL_CAPSULE | Freq: Once | ORAL | Status: AC

## 2017-04-21 MED ORDER — CHLORPROMAZINE HCL 25 MG/ML IJ SOLN
25.0000 mg | Freq: Once | INTRAMUSCULAR | Status: AC
  Administered 2017-04-21: 25 mg via INTRAMUSCULAR
  Filled 2017-04-21: qty 1

## 2017-04-21 MED ORDER — DIPHENHYDRAMINE HCL 50 MG/ML IJ SOLN
50.0000 mg | Freq: Once | INTRAMUSCULAR | Status: AC
  Administered 2017-04-21: 50 mg via INTRAMUSCULAR
  Filled 2017-04-21: qty 1

## 2017-04-21 MED ORDER — LORAZEPAM 1 MG PO TABS: 2.0000 mg | ORAL_TABLET | Freq: Once | ORAL | Status: AC

## 2017-04-21 MED ORDER — HALOPERIDOL LACTATE 5 MG/ML IJ SOLN
20.0000 mg | Freq: Once | INTRAMUSCULAR | Status: AC
  Administered 2017-04-21: 20 mg via INTRAMUSCULAR
  Filled 2017-04-21: qty 4

## 2017-04-21 MED ORDER — HALOPERIDOL 5 MG PO TABS: 20.0000 mg | ORAL_TABLET | Freq: Once | ORAL | Status: AC

## 2017-04-21 MED ORDER — LORAZEPAM 2 MG/ML IJ SOLN
2.0000 mg | Freq: Once | INTRAMUSCULAR | Status: AC
  Administered 2017-04-21: 2 mg via INTRAMUSCULAR
  Filled 2017-04-21: qty 1

## 2017-04-21 NOTE — ED Notes (Signed)
Pt agitated, pacing and swinging at shadows in bathroom.  PA [employer] Berry notified.

## 2017-04-21 NOTE — ED Notes (Signed)
Pt. Awake now after trip to rest room.

## 2017-04-21 NOTE — ED Notes (Signed)
Snack and beverage given. 

## 2017-04-21 NOTE — Progress Notes (Signed)
04/22/27 1315:  Pt came into dayroom where his peer was watching television.  LRT introduced self and asked pt about his day.  Pt stated he hadn't been able to see the doctor.  Pt also expressed he wanted to go outside and smoke a cigarette.  Pt was calm and pleasant.  Pt stated he was tired and went to his room to lay down.  Caroll RancherMarjette Ordell Prichett, LRT/CTRS

## 2017-04-21 NOTE — ED Notes (Signed)
Pt changed his mind earlier and took oral medications.  He is becoming more agitated as he wants to be discharged to smoke a cigarette.  He refuses a nicotine patch.  He paces floor and occasionally yells out loud.  He has also started cursing staff. He refuses to wear a shirt although he has been told multiple times.  Prn order obtained.  15 minute checks and video monitoring in place.

## 2017-04-21 NOTE — ED Notes (Signed)
Report to include situation, background, assessment and recommendations from Diane RN. Patient sleeping, respirations regular and unlabored. Q15 minute rounds and security camera observation to continue.   

## 2017-04-21 NOTE — ED Notes (Signed)
Hourly rounding reveals patient sleeping in room. No complaints, stable, in no acute distress. Q15 minute rounds and monitoring via Security Cameras to continue. 

## 2017-04-21 NOTE — BH Assessment (Signed)
[facility] Assessment Progress Note This Clinical research associatewriter spoke with patient this date to re-evaluate and discuss treatment progress. Patient was standing in the hallway as this writer attempted to redirect patient back to his room unsuccessfully. Patient was very agitated and presented with a angry affect as this Clinical research associatewriter attempted to assess. Patient became verbally abusive with this writer demanding to be discharged stating, "You have no right to keep me here you crazy bitches." Patient  was getting angry insisting on leaving to smoke a cigarette. Patient refused to answer any of this writer's questions in reference to his treatment concerns or questions related to re-evaluation. Collateral gathered from staff nurses and note review indicate patient has been agitated stating "I just want to be left alone, I have demons in me and there is nothing yall can do."  Pt took his shirt back off was jumping and hitting signs in the department. Case was staffed with Jonnalagadda MD who recommended continued inpatient monitoring as appropriate bed placement is investigated.
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
## 2017-04-21 NOTE — ED Notes (Signed)
Pt refuses his meds here saying that [facility] and he takes them at home.  He is calm at present.  He denies S/I,H/I,and AVH.  Will continue to monitor pt.

## 2017-04-21 NOTE — BH Assessment (Signed)
[facility] Assessment Progress Note  Per Leata MouseJanardhana Jonnalagadda, MD, this pt continues to require psychiatric hospitalization at this time.  The following facilities have been contacted to seek placement for this pt, with results as noted:  Beds available, information sent, decision pending:  [facility]   At capacity:  [facility]    Doylene Canninghomas Uriyah [place], [place] Triage Specialist [phone]

## 2017-04-21 NOTE — ED Notes (Signed)
Patient refused vitals  Rn Latricia made aware 

## 2017-04-21 NOTE — ED Notes (Signed)
Hourly rounding reveals patient in room. No complaints, stable, in no acute distress. Q15 minute rounds and monitoring via Security Cameras to continue. 

## 2017-04-22 DIAGNOSIS — F17210 Nicotine dependence, cigarettes, uncomplicated: Secondary | ICD-10-CM

## 2017-04-22 DIAGNOSIS — R443 Hallucinations, unspecified: Secondary | ICD-10-CM

## 2017-04-22 DIAGNOSIS — F1099 Alcohol use, unspecified with unspecified alcohol-induced disorder: Secondary | ICD-10-CM

## 2017-04-22 DIAGNOSIS — F200 Paranoid schizophrenia: Secondary | ICD-10-CM

## 2017-04-22 DIAGNOSIS — F1210 Cannabis abuse, uncomplicated: Secondary | ICD-10-CM

## 2017-04-22 LAB — CBG MONITORING, ED
GLUCOSE-CAPILLARY: 191 mg/dL — AB (ref 65–99)
GLUCOSE-CAPILLARY: 175 mg/dL — AB (ref 65–99)
GLUCOSE-CAPILLARY: 295 mg/dL — AB (ref 65–99)
GLUCOSE-CAPILLARY: 106 mg/dL — AB (ref 65–99)

## 2017-04-22 MED ORDER — CHLORPROMAZINE HCL 25 MG/ML IJ SOLN
25.0000 mg | Freq: Once | INTRAMUSCULAR | Status: AC
  Administered 2017-04-22: 25 mg via INTRAMUSCULAR
  Filled 2017-04-22: qty 1

## 2017-04-22 MED ORDER — LORAZEPAM 2 MG/ML IJ SOLN: 2.0000 mg | Freq: Once | INTRAMUSCULAR | Status: DC

## 2017-04-22 MED ORDER — DIPHENHYDRAMINE HCL 50 MG/ML IJ SOLN: 25.0000 mg | Freq: Once | INTRAMUSCULAR | Status: DC | PRN

## 2017-04-22 MED ORDER — PALIPERIDONE ER 3 MG PO TB24
9.0000 mg | ORAL_TABLET | Freq: Every day | ORAL | Status: DC
  Administered 2017-04-23: 08:00:00 9 mg via ORAL
  Filled 2017-04-22: qty 1

## 2017-04-22 MED ORDER — CHLORPROMAZINE HCL 25 MG/ML IJ SOLN: 25.0000 mg | Freq: Once | INTRAMUSCULAR | Status: DC

## 2017-04-22 MED ORDER — DIPHENHYDRAMINE HCL 50 MG/ML IJ SOLN: 25.0000 mg | Freq: Once | INTRAMUSCULAR | Status: DC

## 2017-04-22 MED ORDER — CHLORPROMAZINE HCL 25 MG/ML IJ SOLN: 25.0000 mg | Freq: Once | INTRAMUSCULAR | Status: DC | PRN

## 2017-04-22 MED ORDER — DIPHENHYDRAMINE HCL 50 MG/ML IJ SOLN
25.0000 mg | Freq: Once | INTRAMUSCULAR | Status: AC
  Administered 2017-04-22: 25 mg via INTRAMUSCULAR
  Filled 2017-04-22: qty 1

## 2017-04-22 MED ORDER — LORAZEPAM 2 MG/ML IJ SOLN: 2.0000 mg | Freq: Once | INTRAMUSCULAR | Status: DC | PRN

## 2017-04-22 NOTE — ED Notes (Signed)
Up to the bathroom 

## 2017-04-22 NOTE — ED Notes (Signed)
Hourly rounding reveals patient in hall. No complaints, stable, in no acute distress. Q15 minute rounds and monitoring via Security Cameras to continue. 

## 2017-04-22 NOTE — ED Notes (Signed)
Hourly rounding reveals patient in room. No complaints, stable, in no acute distress. Q15 minute rounds and monitoring via Security Cameras to continue. 

## 2017-04-22 NOTE — ED Notes (Signed)
Up in the hall spitting on the floor.

## 2017-04-22 NOTE — ED Notes (Signed)
Hourly rounding reveals patient sleeping n room. No complaints, stable, in no acute distress. Q15 minute rounds and monitoring via Security Cameras to continue. 

## 2017-04-22 NOTE — ED Notes (Signed)
May NP aware that pt is calmy sitting in day room watching tv, hold IM's at this time.

## 2017-04-22 NOTE — ED Notes (Signed)
Hourly rounding reveals patient sleeping in room. No complaints, stable, in no acute distress. Q15 minute rounds and monitoring via Security Cameras to continue. 

## 2017-04-22 NOTE — ED Notes (Signed)
Report to include Situation, Background, Assessment, and Recommendations received from Janie Rambo RN. Patient alert and oriented, warm and dry, in no acute distress. Patient denies SI, HI, AVH and pain. Patient made aware of Q15 minute rounds and security cameras for their safety. Patient instructed to come to me with needs or concerns.  
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
## 2017-04-22 NOTE — ED Notes (Signed)
Calm, cooperative, back to room to watch tv

## 2017-04-22 NOTE — ED Notes (Signed)
On the phone, talking calmey

## 2017-04-22 NOTE — ED Notes (Signed)
Pt in day room with eyes closed.

## 2017-04-22 NOTE — ED Notes (Signed)
Hourly rounding reveals patient walking in hall. No complaints, stable, in no acute distress. Q15 minute rounds and monitoring via Security Cameras to continue.  

## 2017-04-22 NOTE — ED Notes (Signed)
On the phone 

## 2017-04-22 NOTE — ED Notes (Signed)
Pt sitting in room cursing at this writer, medicated w/o difficulty

## 2017-04-22 NOTE — ED Notes (Signed)
Up tot he bathroom to shower and change scrubs 

## 2017-04-22 NOTE — ED Notes (Signed)
Medicated w/o difficulty in day room watching tv, calm, cooperative.

## 2017-04-22 NOTE — ED Notes (Addendum)
Up inhall cursing, spitting on glass, walking in hall with fists clinched unable to redirect, NP aware.

## 2017-04-22 NOTE — ED Notes (Signed)
Up walking in the hall 

## 2017-04-22 NOTE — ED Notes (Signed)
Pt's mother into see 

## 2017-04-22 NOTE — ED Notes (Signed)
Pt up in the hall talking with security.  Pt had jumped up stricking the ceiling with his hand, redirectable.

## 2017-04-22 NOTE — ED Notes (Signed)
In day room watching tv, remains calm

## 2017-04-22 NOTE — ED Notes (Addendum)
In day room watching tv, calm.  Pt ran back to his room to get a blanket.  Pt stopped running when directed to do so. And returned to day room to watch tv.

## 2017-04-22 NOTE — ED Notes (Addendum)
Pt initially agreed to medication, but then became argumentative, cursing at staff. After discussion pt agreed and was  medicated w/o difficulty.
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
## 2017-04-22 NOTE — ED Notes (Signed)
Snack given with ginger ale.

## 2017-04-22 NOTE — ED Notes (Addendum)
Pt in dayroom watching tv, calm. Pt encouraged to return to room to rest, but reports that he prefers the dayroom.

## 2017-04-22 NOTE — Progress Notes (Signed)
Per psychiatrist Akintayo and NP Dominga FerryAgustin, patient continues to meet inpatient criteria. CSW contacted [facility] to inquire about patient's referral. Staff member Selena BattenKim reported that patient's referral is not on file. CSW refaxed patient's referral.   [facility] about [facility]'s [facility], staff reported they were at capacity.  [facility] to inquire about [facility]'s [facility]. Staff member Bard HerbertDaphne reported that she does not have patient's referral on file. CSW refaxed patient's referral.   CS[facility] contacted [facility] to inquire about patient's referral, staff reported they were at capacity.   CSW contacted [facility] to inquire about patient's referral, no answer. CSW refaxed patient's referral.  [facility] about [facility]'s [facility]. Staff member Ginger reported that they have not reviewed referrals yet.   [facility] to inquire about [facility]'s referral. Staff member Susy FrizzleMatt reported that patient was too acute.  Celso SickleKimberly Jayah Balthazar, LCSWA [facility]  Clinical Social Worker [phone]

## 2017-04-22 NOTE — ED Notes (Signed)
Pt difficult to redirect, jumping striking ceiling in hall.

## 2017-04-22 NOTE — ED Notes (Signed)
Additional crackers and peanut butter given.

## 2017-04-23 LAB — CBG MONITORING, ED: GLUCOSE-CAPILLARY: 287 mg/dL — AB (ref 65–99)

## 2017-04-23 MED ORDER — PALIPERIDONE ER 9 MG PO TB24: 9.0000 mg | ORAL_TABLET | Freq: Every day | ORAL | 0 refills | Status: AC

## 2017-04-23 MED ORDER — HYDROXYZINE HCL 25 MG PO TABS: 25.0000 mg | ORAL_TABLET | Freq: Three times a day (TID) | ORAL | 0 refills | Status: DC

## 2017-04-23 MED ORDER — GABAPENTIN 100 MG PO CAPS: 200.0000 mg | ORAL_CAPSULE | Freq: Two times a day (BID) | ORAL | 0 refills | Status: AC

## 2017-04-23 NOTE — ED Notes (Signed)
Hourly rounding reveals patient sleeping in room. No complaints, stable, in no acute distress. Q15 minute rounds and monitoring via Security Cameras to continue. 

## 2017-04-23 NOTE — Consult Note (Signed)
[facility] Face-to-Face Psychiatry Consult   Reason for Consult:  Psychosis and aggression Referring Physician:  EDP Patient Identification: Connor Cook MRN:  [id number] Principal Diagnosis: Paranoid schizophrenia [facility]) Diagnosis:   Patient Active Problem List   Diagnosis Date Noted  . Paranoid schizophrenia (HCC) [F20.0] 04/04/2017    Priority: High  . Hallucinations [R44.3]   . Marijuana abuse, continuous [F12.10] 04/04/2017  . Alcohol use disorder (HCC) [F10.99] 04/04/2017  . Redness of eye, right [H57.8] 07/26/2011  . TOBACCO ABUSE [F17.200] 05/07/2010  . MICROALBUMINURIA [R80.9] 05/28/2009  . ACIDOSIS [E87.2] 08/21/2008  . DYSLIPIDEMIA [E78.5] 08/15/2008  . SINUS TACHYCARDIA [I49.8] 12/13/2007  . HYPERTENSION [I10] 11/21/2007  . ASPIRATION PNEUMONIA [J69.0] 11/14/2007  . PYURIA [R82.99] 11/14/2007  . PERSONAL HISTORY OF AFFECTIVE DISORDER [Z86.59] 11/14/2007  . DIABETES MELLITUS, TYPE I [E10.9] 11/13/2007  . PANCREATITIS, HX OF [Z87.19] 11/13/2007  . GASTROINTESTINAL HEMORRHAGE, HX OF [Z87.19] 11/13/2007  . RENAL FAILURE, ACUTE, HX OF [Z87.448] 11/13/2007    Total Time spent with patient: 30 minutes  Subjective:   Connor Cook is a 36 y.o. male patient has stabilized.  HPI:  36 yo male who presented to the ED with psychosis and aggression.  Medications were restarted and he stabilized with no psychosis, no suicidal/homicidal ideations, or substance abuse.  He feels he is "thinking better with the medicine" and requests to discharge to celebrate his mother's birthday tomorrow who he also lives with.  Willing to follow-up with [facility] for his medications, stable for discharge.  Past Psychiatric History: schizophrenia  Risk to Self: Suicidal Ideation: No Suicidal Intent: No Is patient at risk for suicide?: No Suicidal Plan?: No Access to Means: No What has been your use of drugs/alcohol within the last 12 months?: THC & ETOH How many times?: 0 Other Self Harm Risks:  None Triggers for Past Attempts: None known Intentional Self Injurious Behavior: None Risk to Others: None Prior Inpatient Therapy: Prior Inpatient Therapy: No Prior Therapy Dates: Around 2008-2009 Prior Therapy Facilty/Provider(s): [facility] Reason for Treatment: IVC Prior Outpatient Therapy: Prior Outpatient Therapy: Yes Prior Therapy Dates: Last 5 years or more Prior Therapy Facilty/Provider(s): [facility] Reason for Treatment: med monitoring Does patient have an ACCT team?: No Does patient have Intensive In-House Services?  : No Does patient have [facility] services? : Yes Does patient have P4CC services?: No  Past Medical History:  Past Medical History:  Diagnosis Date  . Aspiration pneumonia (HCC)    History of  . Diabetes mellitus dx dec 2008   ,type1 , dx in 2008 with an episode of DKA  . H/O: upper GI bleed   . History of acute pancreatitis   . History of acute renal failure   . Psychosis    receive fluhenazine injections every 2 weeks at [facility]: contact is Merryl Hacker, labeled as Psychotic disorder NOS.   No past surgical history on file. Family History:  Family History  Problem Relation Age of Onset  . Heart disease Father   . Diabetes Father   . Hypertension Mother   . Diabetes Mother   . Diabetes Maternal Aunt   . Diabetes Maternal Uncle   . Mental illness Paternal Uncle   . Diabetes Maternal Grandmother    Family Psychiatric  History: unknown Social History:  History  Alcohol Use  . Yes    Comment: two 40 oz daily     History  Drug Use No    Social History   Social History  . Marital status: Single  Spouse name: N/A  . Number of children: N/A  . Years of education: N/A   Social History Main Topics  . Smoking status: Current Every Day Smoker    Packs/day: 1.00    Years: 3.00  . Smokeless tobacco: Never Used  . Alcohol use Yes     Comment: two 40 oz daily  . Drug use: No  . Sexual activity: Not on file     Comment: quit few months    Other Topics Concern  . Not on file   Social History Narrative   Lives with mom in [place]. Has a girlfiend. Has 1 brother and 2 sisters. No children.   Additional Social History:    Allergies:  No Known Allergies  Labs:  Results for orders placed or performed during the hospital encounter of 04/19/17 (from the past 48 hour(s))  CBG monitoring, ED     Status: Abnormal   Collection Time: 04/21/17 12:55 PM  Result Value Ref Range   Glucose-Capillary 228 (H) 65 - 99 mg/dL  CBG monitoring, ED     Status: Abnormal   Collection Time: 04/21/17  5:29 PM  Result Value Ref Range   Glucose-Capillary 232 (H) 65 - 99 mg/dL  CBG monitoring, ED     Status: Abnormal   Collection Time: 04/21/17  8:41 PM  Result Value Ref Range   Glucose-Capillary 176 (H) 65 - 99 mg/dL  CBG monitoring, ED     Status: Abnormal   Collection Time: 04/22/17  7:40 AM  Result Value Ref Range   Glucose-Capillary 191 (H) 65 - 99 mg/dL  CBG monitoring, ED     Status: Abnormal   Collection Time: 04/22/17 12:04 PM  Result Value Ref Range   Glucose-Capillary 175 (H) 65 - 99 mg/dL  CBG monitoring, ED     Status: Abnormal   Collection Time: 04/22/17  5:13 PM  Result Value Ref Range   Glucose-Capillary 295 (H) 65 - 99 mg/dL  CBG monitoring, ED     Status: Abnormal   Collection Time: 04/22/17  8:35 PM  Result Value Ref Range   Glucose-Capillary 106 (H) 65 - 99 mg/dL  CBG monitoring, ED     Status: Abnormal   Collection Time: 04/23/17  6:30 AM  Result Value Ref Range   Glucose-Capillary 287 (H) 65 - 99 mg/dL    Current Facility-Administered Medications  Medication Dose Route Frequency Provider Last Rate Last Dose  . acetaminophen (TYLENOL) tablet 650 mg  650 mg Oral Q4H PRN Long, Arlyss Repress, MD      . alum & mag hydroxide-simeth (MAALOX/MYLANTA) 200-200-20 MG/5ML suspension 30 mL  30 mL Oral Q6H PRN Long, Arlyss Repress, MD      . aspirin EC tablet 81 mg  81 mg Oral Daily Long, Arlyss Repress, MD   81 mg at 04/23/17 0829  .  atorvastatin (LIPITOR) tablet 10 mg  10 mg Oral q1800 Long, Arlyss Repress, MD   10 mg at 04/22/17 1724  . chlorproMAZINE (THORAZINE) injection 25 mg  25 mg Intramuscular Once PRN Adonis Brook, NP      . diphenhydrAMINE (BENADRYL) injection 25 mg  25 mg Intramuscular Once PRN Adonis Brook, NP      . gabapentin (NEURONTIN) capsule 200 mg  200 mg Oral BID Nettie Cromwell, MD   200 mg at 04/23/17 0826  . hydrOXYzine (ATARAX/VISTARIL) tablet 25 mg  25 mg Oral TID Thedore Mins, MD   25 mg at 04/23/17 0826  . ibuprofen (ADVIL,MOTRIN) tablet 600  mg  600 mg Oral Q8H PRN Long, Arlyss RepressJoshua G, MD      . insulin glargine (LANTUS) injection 16 Units  16 Units Subcutaneous Q2200 Long, Arlyss RepressJoshua G, MD   16 Units at 04/22/17 2200  . metFORMIN (GLUCOPHAGE) tablet 500 mg  500 mg Oral BID WC Long, Arlyss RepressJoshua G, MD   500 mg at 04/23/17 0826  . nicotine (NICODERM CQ - dosed in mg/24 hours) patch 21 mg  21 mg Transdermal Daily Long, Arlyss RepressJoshua G, MD   21 mg at 04/23/17 0827  . ondansetron (ZOFRAN) tablet 4 mg  4 mg Oral Q8H PRN Long, Arlyss RepressJoshua G, MD      . paliperidone (INVEGA) 24 hr tablet 9 mg  9 mg Oral Daily Madelena Maturin, MD   9 mg at 04/23/17 0828  . thiamine (VITAMIN B-1) tablet 100 mg  100 mg Oral Daily Long, Arlyss RepressJoshua G, MD   100 mg at 04/23/17 16100828   Or  . thiamine (B-1) injection 100 mg  100 mg Intravenous Daily Long, Arlyss RepressJoshua G, MD       Current Outpatient Prescriptions  Medication Sig Dispense Refill  . aspirin EC 81 MG tablet Take 1 tablet (81 mg total) by mouth daily. 100 tablet 1  . atorvastatin (LIPITOR) 10 MG tablet Take 1 tablet (10 mg total) by mouth daily. 90 tablet 3  . insulin aspart (NOVOLOG) 100 UNIT/ML injection 8 units subcut daily with dinner 10 mL 11  . Insulin Glargine (LANTUS SOLOSTAR) 100 UNIT/ML Solostar Pen Inject 16 Units into the skin daily at 10 pm. 5 pen PRN  . metFORMIN (GLUCOPHAGE) 500 MG tablet Take 1 tablet (500 mg total) by mouth 2 (two) times daily with a meal. 180 tablet 3  . Insulin Pen  Needle (PEN NEEDLES) 32G X 5 MM MISC 1 Bottle by Does not apply route daily. 100 each 6  . Insulin Syringe-Needle U-100 (GLOBAL INSULIN SYRINGES) 30G X 1/2" 0.3 ML MISC Use as directed 100 each 5    Musculoskeletal: Strength & Muscle Tone: within normal limits Gait & Station: normal Patient leans: N/A  Psychiatric Specialty Exam: Physical Exam  Constitutional: He is oriented to person, place, and time. He appears well-developed and well-nourished.  HENT:  Head: Normocephalic.  Neck: Normal range of motion.  Respiratory: Effort normal.  Musculoskeletal: Normal range of motion.  Neurological: He is alert and oriented to person, place, and time.  Psychiatric: He has a normal mood and affect. His speech is normal and behavior is normal. Judgment and thought content normal. Cognition and memory are normal.    Review of Systems  All other systems reviewed and are negative.   Blood pressure 112/75, pulse 96, temperature 97.5 F (36.4 C), temperature source Oral, resp. rate 18, SpO2 100 %.There is no height or weight on file to calculate BMI.  General Appearance: Casual  Eye Contact:  Good  Speech:  Normal Rate  Volume:  Normal  Mood:  Euthymic  Affect:  Congruent  Thought Process:  Coherent and Descriptions of Associations: Intact  Orientation:  Full (Time, Place, and Person)  Thought Content:  WDL and Logical  Suicidal Thoughts:  No  Homicidal Thoughts:  No  Memory:  Immediate;   Good Recent;   Good Remote;   Good  Judgement:  Fair  Insight:  Fair  Psychomotor Activity:  Normal  Concentration:  Concentration: Good and Attention Span: Good  Recall:  Good  Fund of Knowledge:  Fair  Language:  Good  Akathisia:  No  Handed:  Right  AIMS (if indicated):     Assets:  Housing Leisure Time Physical Health Resilience Social Support  ADL's:  Intact  Cognition:  WNL  Sleep:        Treatment Plan Summary: Daily contact with patient to assess and evaluate symptoms and  progress in treatment, Medication management and Plan schizophrenia, paranoid type:  -Crisis stabilization -Medication management:  Continue gabapentin 200 mg BID for mood stabilization and Invega 9 m daily for psychosis along with Vistaril 25 mg TID for anxiety -Individual and substance abuse counseling  Disposition: No evidence of imminent risk to self or others at present.    Nanine Means, NP 04/23/2017 10:36 AM  Patient seen face-to-face for psychiatric evaluation, chart reviewed and case discussed with the physician extender and developed treatment plan. Reviewed the information documented and agree with the treatment plan. Thedore Mins, MD

## 2017-04-23 NOTE — ED Notes (Signed)
Patient discharged to home/self care.  Upon discharge patient denies SI, HI and AVH.  Patient stated that he was going to catch the bus home and was looking forward to being home for his mother's birthday.   Patient acknowledged understanding of all discharge instructions and the receipt of all belongings and prescriptions.

## 2017-04-23 NOTE — ED Notes (Signed)
Hourly rounding reveals patient in hall. No complaints, stable, in no acute distress. Q15 minute rounds and monitoring via Security Cameras to continue. 

## 2017-04-23 NOTE — ED Notes (Signed)
Hourly rounding reveals patient in room. No complaints, stable, in no acute distress. Q15 minute rounds and monitoring via Security Cameras to continue. 

## 2017-04-23 NOTE — Progress Notes (Signed)
CSW filed patient's notice of commitment change paperwork into IVC logbook.   Eisha Chatterjee, LCSWA [facility]  Clinical Social Worker [phone] 

## 2017-04-23 NOTE — Discharge Instructions (Signed)
For your ongoing mental health needs, you are advised to follow up with [facility].  New and returning patients are seen at their walk-in clinic.  Walk-in hours are Monday - Friday from 8:00 am - 3:00 pm.  Walk-in patients are seen on a first come, first served basis.  Try to arrive as early as possible for he best chance of being seen the same day: ° °     [facility] °     [address] °     [place], [place] [address] °     [phone] °

## 2017-04-23 NOTE — ED Notes (Signed)
Hourly rounding reveals patient walking in hall. No complaints, stable, in no acute distress. Q15 minute rounds and monitoring via Security Cameras to continue.  

## 2017-04-23 NOTE — ED Notes (Signed)
Hourly rounding reveals patient in room. Pt. States he wants to go home, he is stable, in no acute distress. Q15 minute rounds and monitoring via [employer] to continue.

## 2017-04-23 NOTE — [facility] Suicide Risk Assessment (Signed)
Suicide Risk Assessment  Discharge Assessment   [facility]   Principal Problem: Paranoid schizophrenia [facility](HCC) Discharge Diagnoses:  Patient Active Problem List   Diagnosis Date Noted  . Paranoid schizophrenia (HCC) [F20.0] 04/04/2017    Priority: High  . Hallucinations [R44.3]   . Marijuana abuse, continuous [F12.10] 04/04/2017  . Alcohol use disorder (HCC) [F10.99] 04/04/2017  . Redness of eye, right [H57.8] 07/26/2011  . TOBACCO ABUSE [F17.200] 05/07/2010  . MICROALBUMINURIA [R80.9] 05/28/2009  . ACIDOSIS [E87.2] 08/21/2008  . DYSLIPIDEMIA [E78.5] 08/15/2008  . SINUS TACHYCARDIA [I49.8] 12/13/2007  . HYPERTENSION [I10] 11/21/2007  . ASPIRATION PNEUMONIA [J69.0] 11/14/2007  . PYURIA [R82.99] 11/14/2007  . PERSONAL HISTORY OF AFFECTIVE DISORDER [Z86.59] 11/14/2007  . DIABETES MELLITUS, TYPE I [E10.9] 11/13/2007  . PANCREATITIS, HX OF [Z87.19] 11/13/2007  . GASTROINTESTINAL HEMORRHAGE, HX OF [Z87.19] 11/13/2007  . RENAL FAILURE, ACUTE, HX OF [Z87.448] 11/13/2007    Total Time spent with patient: 30 minutes  Musculoskeletal: Strength & Muscle Tone: within normal limits Gait & Station: normal Patient leans: N/A  Psychiatric Specialty Exam: Physical Exam  Constitutional: He is oriented to person, place, and time. He appears well-developed and well-nourished.  HENT:  Head: Normocephalic.  Neck: Normal range of motion.  Respiratory: Effort normal.  Musculoskeletal: Normal range of motion.  Neurological: He is alert and oriented to person, place, and time.  Psychiatric: He has a normal mood and affect. His speech is normal and behavior is normal. Judgment and thought content normal. Cognition and memory are normal.    Review of Systems  All other systems reviewed and are negative.   Blood pressure 112/75, pulse 96, temperature 97.5 F (36.4 C), temperature source Oral, resp. rate 18, SpO2 100 %.There is no height or weight on file to calculate  BMI.  General Appearance: Casual  Eye Contact:  Good  Speech:  Normal Rate  Volume:  Normal  Mood:  Euthymic  Affect:  Congruent  Thought Process:  Coherent and Descriptions of Associations: Intact  Orientation:  Full (Time, Place, and Person)  Thought Content:  WDL and Logical  Suicidal Thoughts:  No  Homicidal Thoughts:  No  Memory:  Immediate;   Good Recent;   Good Remote;   Good  Judgement:  Fair  Insight:  Fair  Psychomotor Activity:  Normal  Concentration:  Concentration: Good and Attention Span: Good  Recall:  Good  Fund of Knowledge:  Fair  Language:  Good  Akathisia:  No  Handed:  Right  AIMS (if indicated):     Assets:  Housing Leisure Time Physical Health Resilience Social Support  ADL's:  Intact  Cognition:  WNL  Sleep:       Mental Status Per Nursing Assessment::   On Admission:   psychosis and aggression  Demographic Factors:  Male  Loss Factors: NA  Historical Factors: NA  Risk Reduction Factors:   Sense of responsibility to family, Living with another person, especially a relative, Positive social support and Positive therapeutic relationship  Continued Clinical Symptoms:  None  Cognitive Features That Contribute To Risk:  None    Suicide Risk:  Minimal: No identifiable suicidal ideation.  Patients presenting with no risk factors but with morbid ruminations; may be classified as minimal risk based on the severity of the depressive symptoms    Plan Of Care/Follow-up recommendations:  Activity:  as tolerated Diet:  heart healthy diet  Connor Masi, NP 04/23/2017, 10:48 AM

## 2017-04-23 NOTE — ED Notes (Signed)
Pt. To shower

## 2017-04-26 NOTE — ED Notes (Signed)
Chart opened to review insulin given by this writer on 04/22/17

## 2017-05-16 ENCOUNTER — Ambulatory Visit: Payer: Self-pay | Admitting: Internal Medicine

## 2017-07-16 ENCOUNTER — Encounter: Payer: Self-pay | Admitting: Internal Medicine

## 2017-07-16 NOTE — Progress Notes (Signed)
Received info that pt was hosp at [facility] 8/7-9/21/2018. Has f/u appt with [facility] 07/15/2017, contact person there is Lady Gary [phone].

## 2017-07-21 ENCOUNTER — Ambulatory Visit: Payer: Self-pay | Attending: Internal Medicine | Admitting: Internal Medicine

## 2017-07-21 ENCOUNTER — Encounter: Payer: Self-pay | Admitting: Internal Medicine

## 2017-07-21 VITALS — BP 120/77 | HR 108 | Temp 100.0°F | Resp 16 | Wt 240.6 lb

## 2017-07-21 DIAGNOSIS — Z79899 Other long term (current) drug therapy: Secondary | ICD-10-CM | POA: Insufficient documentation

## 2017-07-21 DIAGNOSIS — IMO0001 Reserved for inherently not codable concepts without codable children: Secondary | ICD-10-CM

## 2017-07-21 DIAGNOSIS — F17210 Nicotine dependence, cigarettes, uncomplicated: Secondary | ICD-10-CM | POA: Insufficient documentation

## 2017-07-21 DIAGNOSIS — F17200 Nicotine dependence, unspecified, uncomplicated: Secondary | ICD-10-CM

## 2017-07-21 DIAGNOSIS — I10 Essential (primary) hypertension: Secondary | ICD-10-CM | POA: Insufficient documentation

## 2017-07-21 DIAGNOSIS — E1165 Type 2 diabetes mellitus with hyperglycemia: Secondary | ICD-10-CM | POA: Insufficient documentation

## 2017-07-21 DIAGNOSIS — F1210 Cannabis abuse, uncomplicated: Secondary | ICD-10-CM | POA: Insufficient documentation

## 2017-07-21 DIAGNOSIS — Z7982 Long term (current) use of aspirin: Secondary | ICD-10-CM | POA: Insufficient documentation

## 2017-07-21 DIAGNOSIS — Z7289 Other problems related to lifestyle: Secondary | ICD-10-CM | POA: Insufficient documentation

## 2017-07-21 DIAGNOSIS — Z794 Long term (current) use of insulin: Secondary | ICD-10-CM | POA: Insufficient documentation

## 2017-07-21 DIAGNOSIS — F200 Paranoid schizophrenia: Secondary | ICD-10-CM | POA: Insufficient documentation

## 2017-07-21 DIAGNOSIS — E785 Hyperlipidemia, unspecified: Secondary | ICD-10-CM | POA: Insufficient documentation

## 2017-07-21 LAB — POCT GLYCOSYLATED HEMOGLOBIN (HGB A1C): HEMOGLOBIN A1C: 9.8

## 2017-07-21 LAB — POCT URINALYSIS DIPSTICK
Bilirubin, UA: NEGATIVE
Glucose, UA: 500
Ketones, UA: NEGATIVE
LEUKOCYTES UA: NEGATIVE
Nitrite, UA: NEGATIVE
PH UA: 5.5 (ref 5.0–8.0)
RBC UA: NEGATIVE
Spec Grav, UA: <=1.005 — AB (ref 1.010–1.025)
Urobilinogen, UA: 0.2 E.U./dL

## 2017-07-21 LAB — GLUCOSE, POCT (MANUAL RESULT ENTRY)
POC GLUCOSE: 450 mg/dL — AB (ref 70–99)
POC GLUCOSE: 410 mg/dL — AB (ref 70–99)
POC GLUCOSE: 456 mg/dL — AB (ref 70–99)

## 2017-07-21 MED ORDER — INSULIN GLARGINE 100 UNIT/ML SOLOSTAR PEN: 16.0000 [IU] | PEN_INJECTOR | Freq: Every day | SUBCUTANEOUS | 99 refills | Status: AC

## 2017-07-21 MED ORDER — PEN NEEDLES 32G X 5 MM MISC: 1.0000 | Freq: Every day | 6 refills | Status: AC

## 2017-07-21 MED ORDER — INSULIN ASPART 100 UNIT/ML ~~LOC~~ SOLN: 6.0000 [IU] | Freq: Once | SUBCUTANEOUS | Status: AC

## 2017-07-21 MED ORDER — ATORVASTATIN CALCIUM 10 MG PO TABS: 10.0000 mg | ORAL_TABLET | Freq: Every day | ORAL | 3 refills | Status: AC

## 2017-07-21 MED ORDER — METFORMIN HCL 500 MG PO TABS: 500.0000 mg | ORAL_TABLET | Freq: Two times a day (BID) | ORAL | 3 refills | Status: AC

## 2017-07-21 MED ORDER — GLIPIZIDE ER 10 MG PO TB24: 10.0000 mg | ORAL_TABLET | Freq: Every day | ORAL | 6 refills | Status: AC

## 2017-07-21 MED ORDER — TRUE METRIX METER W/DEVICE KIT: PACK | 0 refills | Status: AC

## 2017-07-21 MED ORDER — GLUCOSE BLOOD VI STRP: ORAL_STRIP | 12 refills | Status: AC

## 2017-07-21 MED ORDER — INSULIN ASPART 100 UNIT/ML ~~LOC~~ SOLN
20.0000 [IU] | Freq: Once | SUBCUTANEOUS | Status: AC
  Administered 2017-07-21: 20 [IU] via SUBCUTANEOUS

## 2017-07-21 MED ORDER — TRUEPLUS LANCETS 28G MISC: 6 refills | Status: AC

## 2017-07-21 MED FILL — TRUEPLUS PEN NDL 31GX3/16: 31G X 5 MM | 30 days supply | Qty: 100 | Fill #0

## 2017-07-21 MED FILL — TRUEplus LANCETS 28G MISC: 30 days supply | Qty: 100 | Fill #0

## 2017-07-21 MED FILL — !TRUE METRIX BLOOD GLUCOSE: 365 days supply | Qty: 1 | Fill #0

## 2017-07-21 MED FILL — TRUEPLUS PEN NDL 31GX3/16": 31G X 5 MM | 30 days supply | Qty: 100 | Fill #0

## 2017-07-21 MED FILL — ?METFORMIN HCL 500MG TABLET: 500 | 30 days supply | Qty: 60 | Fill #0

## 2017-07-21 MED FILL — TRUE METRIX TEST STRIP: 30 days supply | Qty: 100 | Fill #0

## 2017-07-21 MED FILL — ?ATORVASTATIN 10 MG TABLET: 10 | 30 days supply | Qty: 30 | Fill #0

## 2017-07-21 MED FILL — !LANTUS SOLOSTAR 100UNITS/M: 100 | 37 days supply | Qty: 6 | Fill #0

## 2017-07-21 NOTE — Progress Notes (Signed)
Patient ID: Connor Cook, male    DOB: 26-Oct-1980  MRN: [id number]  CC: Follow-up (ED)   Subjective: Connor Cook is a 36 y.o. male who presents for chronic ds management. Last seen 03/2017. Mom is with him His concerns today include:  Patient with history of diabetes type 2, hypertension, tobacco dependence, paranoid schizophrenia, ETOH use disorder and hyperlipidemia. He forgot to bring meds with him today  1. Paranoid schizophrenia: -pt was hosp at [facility] in [place] for 1.5 mth. Released last Thursday -worker from [employer] has f/u with him already -did not bring meds. He recalls 2 of his medications as being Risperdal and invaga.   2. DM: -started on metformin, Lantus and NovoLog on last visit with me in June.  -Reports receiving metformin and insulin while he was at [facility] -Since he was released he has been on just metformin. Denies polyuria. +polydipsia. No blurred vision.   Patient Active Problem List   Diagnosis Date Noted  . Hallucinations   . Paranoid schizophrenia ([place]) 04/04/2017  . Marijuana abuse, continuous 04/04/2017  . Alcohol use disorder ([place]) 04/04/2017  . TOBACCO ABUSE 05/07/2010  . 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 DYSLIPIDEMIA 08/15/2008  . HYPERTENSION 11/21/2007  . PERSONAL HISTORY OF AFFECTIVE DISORDER 11/14/2007  . DIABETES MELLITUS, TYPE I 11/13/2007  . PANCREATITIS, HX OF 11/13/2007  . GASTROINTESTINAL HEMORRHAGE, HX OF 11/13/2007     Current Outpatient Prescriptions on File Prior to Visit  Medication Sig Dispense Refill  . aspirin EC 81 MG tablet Take 1 tablet (81 mg total) by mouth daily. 100 tablet 1  . gabapentin (NEURONTIN) 100 MG capsule Take 2 capsules (200 mg total) by mouth 2 (two) times daily. 60 capsule 0  . Insulin Syringe-Needle U-100 (GLOBAL INSULIN SYRINGES) 30G X 1/2" 0.3 ML MISC Use as directed 100 each 5  . paliperidone (INVEGA) 9 MG 24 hr tablet Take 1 tablet (9 mg total) by  mouth daily. 30 tablet 0   No current facility-administered medications on file prior to visit.     No Known Allergies  Social History   Social History  . Marital status: Single    Spouse name: N/A  . Number of children: N/A  . Years of education: N/A   Occupational History  . Not on file.   Social History Main Topics  . Smoking status: Current Every Day Smoker    Packs/day: 1.00    Years: 3.00  . Smokeless tobacco: Never Used  . Alcohol use Yes     Comment: two 40 oz daily  . Drug use: No  . Sexual activity: Not on file     Comment: quit few months   Other Topics Concern  . Not on file   Social History Narrative   Lives with mom in [place]. Has a girlfiend. Has 1 brother and 2 sisters. No children.    Family History  Problem Relation Age of Onset  . Heart disease Father   . Diabetes Father   . Hypertension Mother   . Diabetes Mother   . Diabetes Maternal Aunt   . Diabetes Maternal Uncle   . Mental illness Paternal Uncle   . Diabetes Maternal Grandmother     No past surgical history on file.  ROS: Review of Systems Negative except as stated above  PHYSICAL EXAM: BP 120/77   Pulse (!) 108   Temp 100 F (37.8 C) (Oral)   Resp 16   Wt 240 lb 9.6 oz (109.1 kg)  SpO2 95%   BMI 32.63 kg/m   Physical Exam  General appearance -53 African-American male in NAD Mental status - flat affect.  Mouth - mucous membranes moist, pharynx normal without lesions Chest - clear to auscultation, no wheezes, rales or rhonchi, symmetric air entry Heart - normal rate, regular rhythm, normal S1, S2, no murmurs, rubs, clicks or gallops Extremities - peripheral pulses normal, no pedal edema, no clubbing or cyanosis  BS 450  Lab Results  Component Value Date   HGBA1C 9.8 07/21/2017    ASSESSMENT AND PLAN: 1. Uncontrolled type 2 diabetes mellitus without complication, with long-term current use of insulin ([place]) -Patient given 26 units of NovoLog here in the  office today. -He will continue metformin. Glucotrol XL 10 mg and Lantus 16 units daily added Discussed the importance of healthy eating habits, regular aerobic exercise (at least 150 minutes a week as tolerated) and medication compliance to achieve or maintain control of diabetes. -Prescription given for glucometer and supplies. Advised patient to check blood sugars at least once a day and bring in readings on visit with clinical pharmacists in 2 weeks - POCT glucose (manual entry) - POCT glycosylated hemoglobin (Hb A1C) - insulin aspart (novoLOG) injection 20 Units; Inject 0.2 mLs (20 Units total) into the skin once. - Insulin Glargine (LANTUS SOLOSTAR) 100 UNIT/ML Solostar Pen; Inject 16 Units into the skin daily at 10 pm.  Dispense: 5 pen; Refill: PRN - metFORMIN (GLUCOPHAGE) 500 MG tablet; Take 1 tablet (500 mg total) by mouth 2 (two) times daily with a meal.  Dispense: 180 tablet; Refill: 3 - glipiZIDE (GLUCOTROL XL) 10 MG 24 hr tablet; Take 1 tablet (10 mg total) by mouth daily with breakfast.  Dispense: 30 tablet; Refill: 6 - Insulin Pen Needle (PEN NEEDLES) 32G X 5 MM MISC; 1 Bottle by Does not apply route daily.  Dispense: 100 each; Refill: 6 - atorvastatin (LIPITOR) 10 MG tablet; Take 1 tablet (10 mg total) by mouth daily.  Dispense: 90 tablet; Refill: 3 - Basic Metabolic Panel - POCT urinalysis dipstick - POCT glucose (manual entry) - insulin aspart (novoLOG) injection 6 Units; Inject 0.06 mLs (6 Units total) into the skin once. - POCT glucose (manual entry)  2. Paranoid schizophrenia ([place]) Followed by a [facility]  3. Tobacco dependence Encourage smoking cessation  Patient to follow-up with me in one month. Have requested that he bring all of his medications with him on that visit for reconciliation. Patient was given the opportunity to ask questions.  Patient verbalized understanding of the plan and was able to repeat key elements of the plan.   Orders Placed  This Encounter  Procedures  . Basic Metabolic Panel  . POCT glucose (manual entry)  . POCT glycosylated hemoglobin (Hb A1C)  . POCT urinalysis dipstick  . POCT glucose (manual entry)  . POCT glucose (manual entry)     Requested Prescriptions   Signed Prescriptions Disp Refills  . Insulin Glargine (LANTUS SOLOSTAR) 100 UNIT/ML Solostar Pen 5 pen PRN    Sig: Inject 16 Units into the skin daily at 10 pm.  . metFORMIN (GLUCOPHAGE) 500 MG tablet 180 tablet 3    Sig: Take 1 tablet (500 mg total) by mouth 2 (two) times daily with a meal.  . glipiZIDE (GLUCOTROL XL) 10 MG 24 hr tablet 30 tablet 6    Sig: Take 1 tablet (10 mg total) by mouth daily with breakfast.  . Insulin Pen Needle (PEN NEEDLES) 32G X 5 MM MISC 100  each 6    Sig: 1 Bottle by Does not apply route daily.  Marland Kitchen atorvastatin (LIPITOR) 10 MG tablet 90 tablet 3    Sig: Take 1 tablet (10 mg total) by mouth daily.  . Blood Glucose Monitoring Suppl (TRUE METRIX METER) w/Device KIT 1 kit 0    Sig: Use as directed  . glucose blood (TRUE METRIX BLOOD GLUCOSE TEST) test strip 100 each 12    Sig: Use as instructed  . TRUEPLUS LANCETS 28G MISC 100 each 6    Sig: Use as directed    Return in about 1 month (around 08/20/2017).  Karle Plumber, MD, FACP

## 2017-07-21 NOTE — Patient Instructions (Addendum)
Please give patient an appointment to see Stacy in 2 weeks.   For Diabetes you should be on: Glucotrol XL 10 mg daily, Metformin 500 mg twice a day and Lantus insulin 16 units at bedtime.

## 2017-07-22 LAB — BASIC METABOLIC PANEL
BUN/Creatinine Ratio: 10 (ref 9–20)
BUN: 14 mg/dL (ref 6–20)
CALCIUM: 9.2 mg/dL (ref 8.7–10.2)
CHLORIDE: 94 mmol/L — AB (ref 96–106)
CO2: 23 mmol/L (ref 20–29)
Creatinine, Ser: 1.43 mg/dL — ABNORMAL HIGH (ref 0.76–1.27)
GFR calc Af Amer: 71 mL/min/{1.73_m2} (ref >59)
GFR, EST NON AFRICAN AMERICAN: 62 mL/min/{1.73_m2} (ref >59)
Glucose: 441 mg/dL — ABNORMAL HIGH (ref 65–99)
POTASSIUM: 4.3 mmol/L (ref 3.5–5.2)
Sodium: 135 mmol/L (ref 134–144)

## 2017-08-08 ENCOUNTER — Ambulatory Visit: Payer: Self-pay | Admitting: Pharmacist

## 2017-08-08 NOTE — Progress Notes (Deleted)
    S:     No chief complaint on file.   Patient arrives ***.  Presents for diabetes evaluation, education, and management at the request of Dr. Laural Benes. Patient was referred on 07/21/17.  Patient was last seen by Primary Care Provider on 07/21/17.   Patient {Actions; denies-reports:120008} adherence with medications.  Current diabetes medications include: Lantus 16 units daily, metformin 500 mg BID, glipizide XL 10 mg daily.   Patient {Actions; denies-reports:120008} hypoglycemic events.  Patient reported dietary habits: Eats *** meals/day Breakfast:*** Lunch:*** Dinner:*** Snacks:*** Drinks:***  Patient reported exercise habits:    Patient {Actions; denies-reports:120008} nocturia.  Patient {Actions; denies-reports:120008} neuropathy. Patient {Actions; denies-reports:120008} visual changes. Patient {Actions; denies-reports:120008} self foot exams.    O:  Physical Exam   ROS   Lab Results  Component Value Date   HGBA1C 9.8 07/21/2017   There were no vitals filed for this visit.  Home fasting CBG: ***  2 hour post-prandial/random CBG: ***.  10 year ASCVD risk: ***.  A/P: Diabetes longstanding currently uncontrolled based on A1c of 9.8. Patient {Actions; denies-reports:120008} hypoglycemic events and is able to verbalize appropriate hypoglycemia management plan. Patient {Actions; denies-reports:120008} adherence with medication. Control is suboptimal due to noncompliance with medications and dietary indiscretion.  Next A1C anticipated January 2019.    Written patient instructions provided.  Total time in face to face counseling *** minutes.   Follow up in Pharmacist Clinic Visit ***.   Patient seen with Theodoro Kos, PharmD Candidate

## 2017-08-24 ENCOUNTER — Ambulatory Visit: Payer: Self-pay | Admitting: Internal Medicine

## 2017-11-22 IMAGING — DX DG CHEST 2V
2 series · 2 of 2 positions shown · non-contrast
Comparison: None.

CLINICAL DATA: Left-sided chest pain lifting heavy boxes at work.

EXAM:
CHEST  2 VIEW

[chest pa]
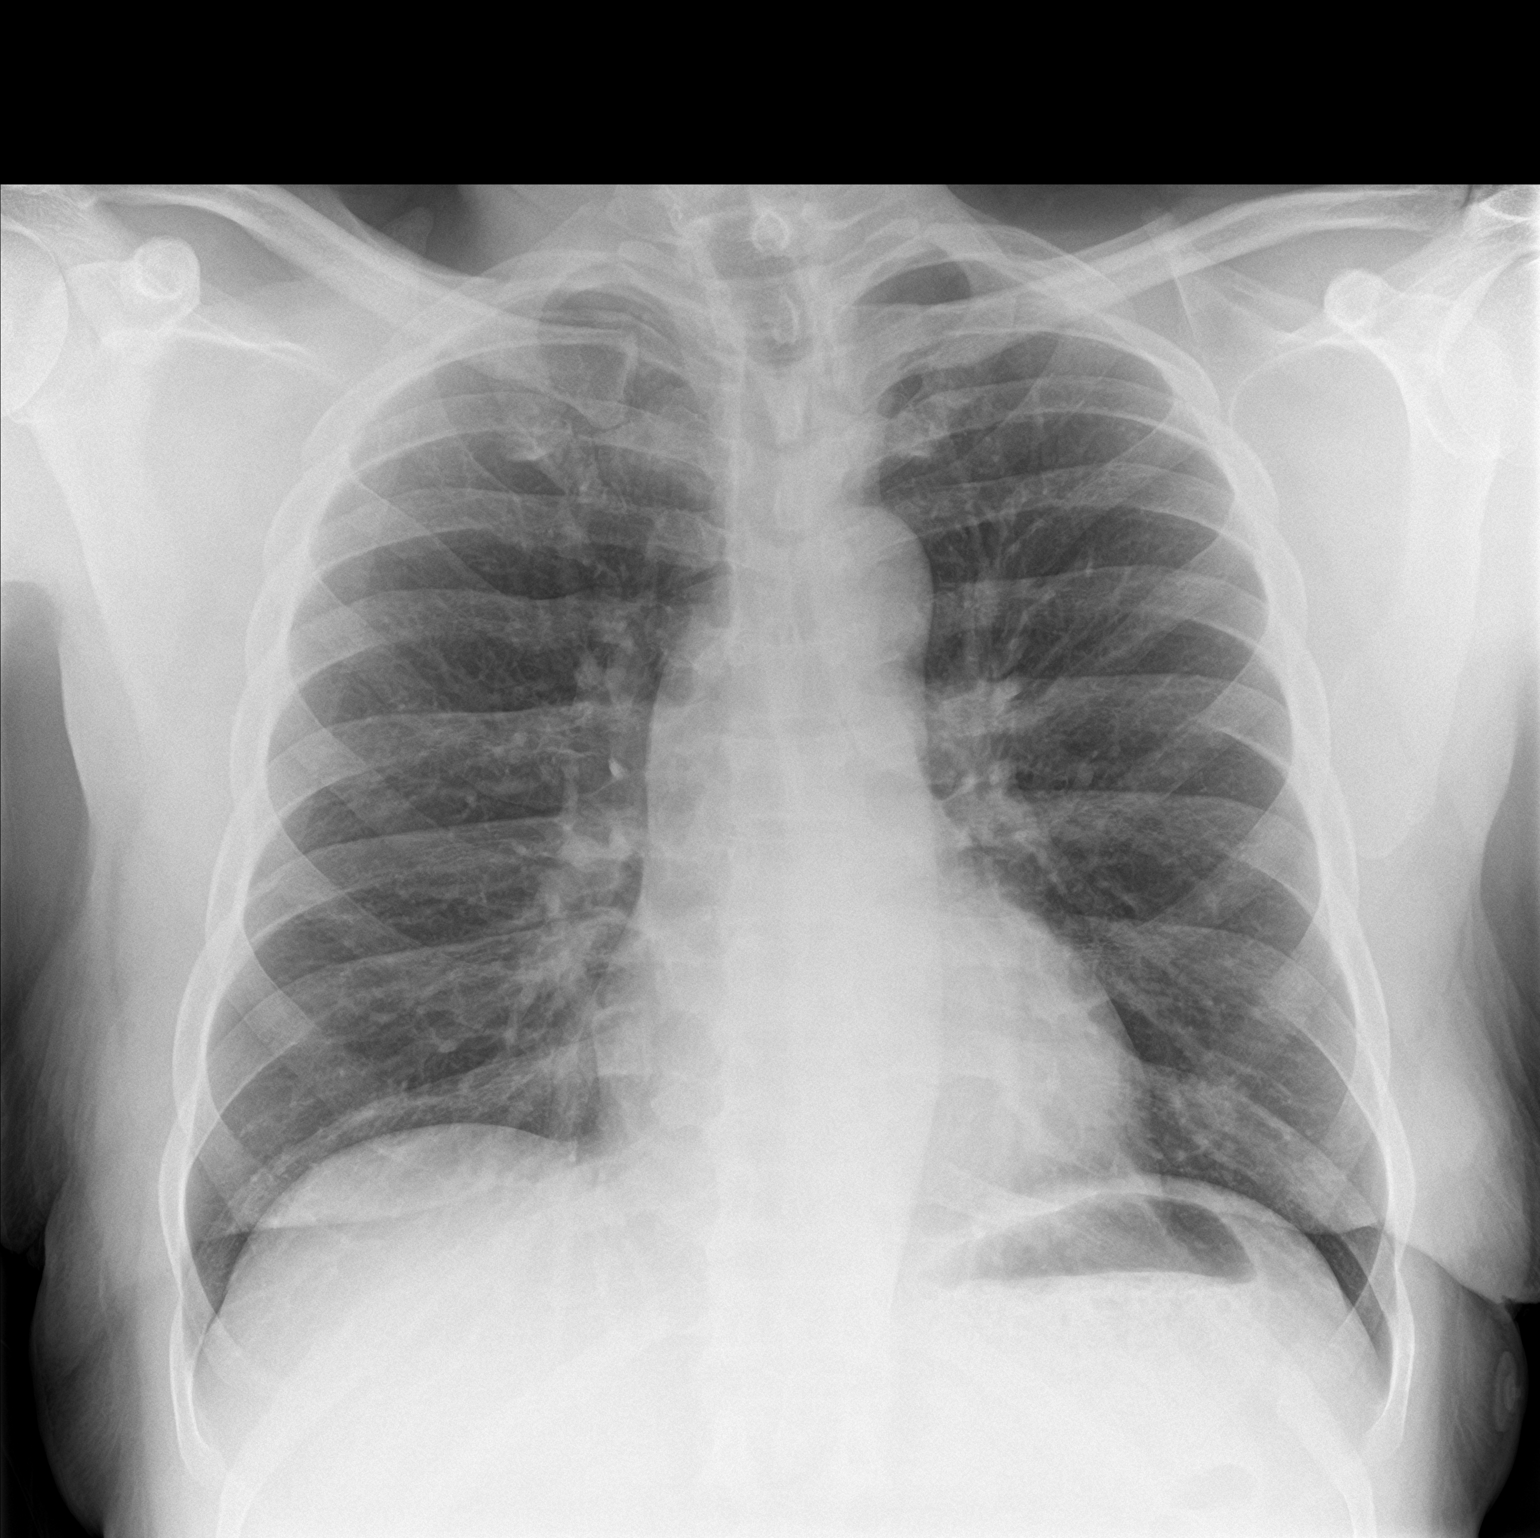

[chest lat]
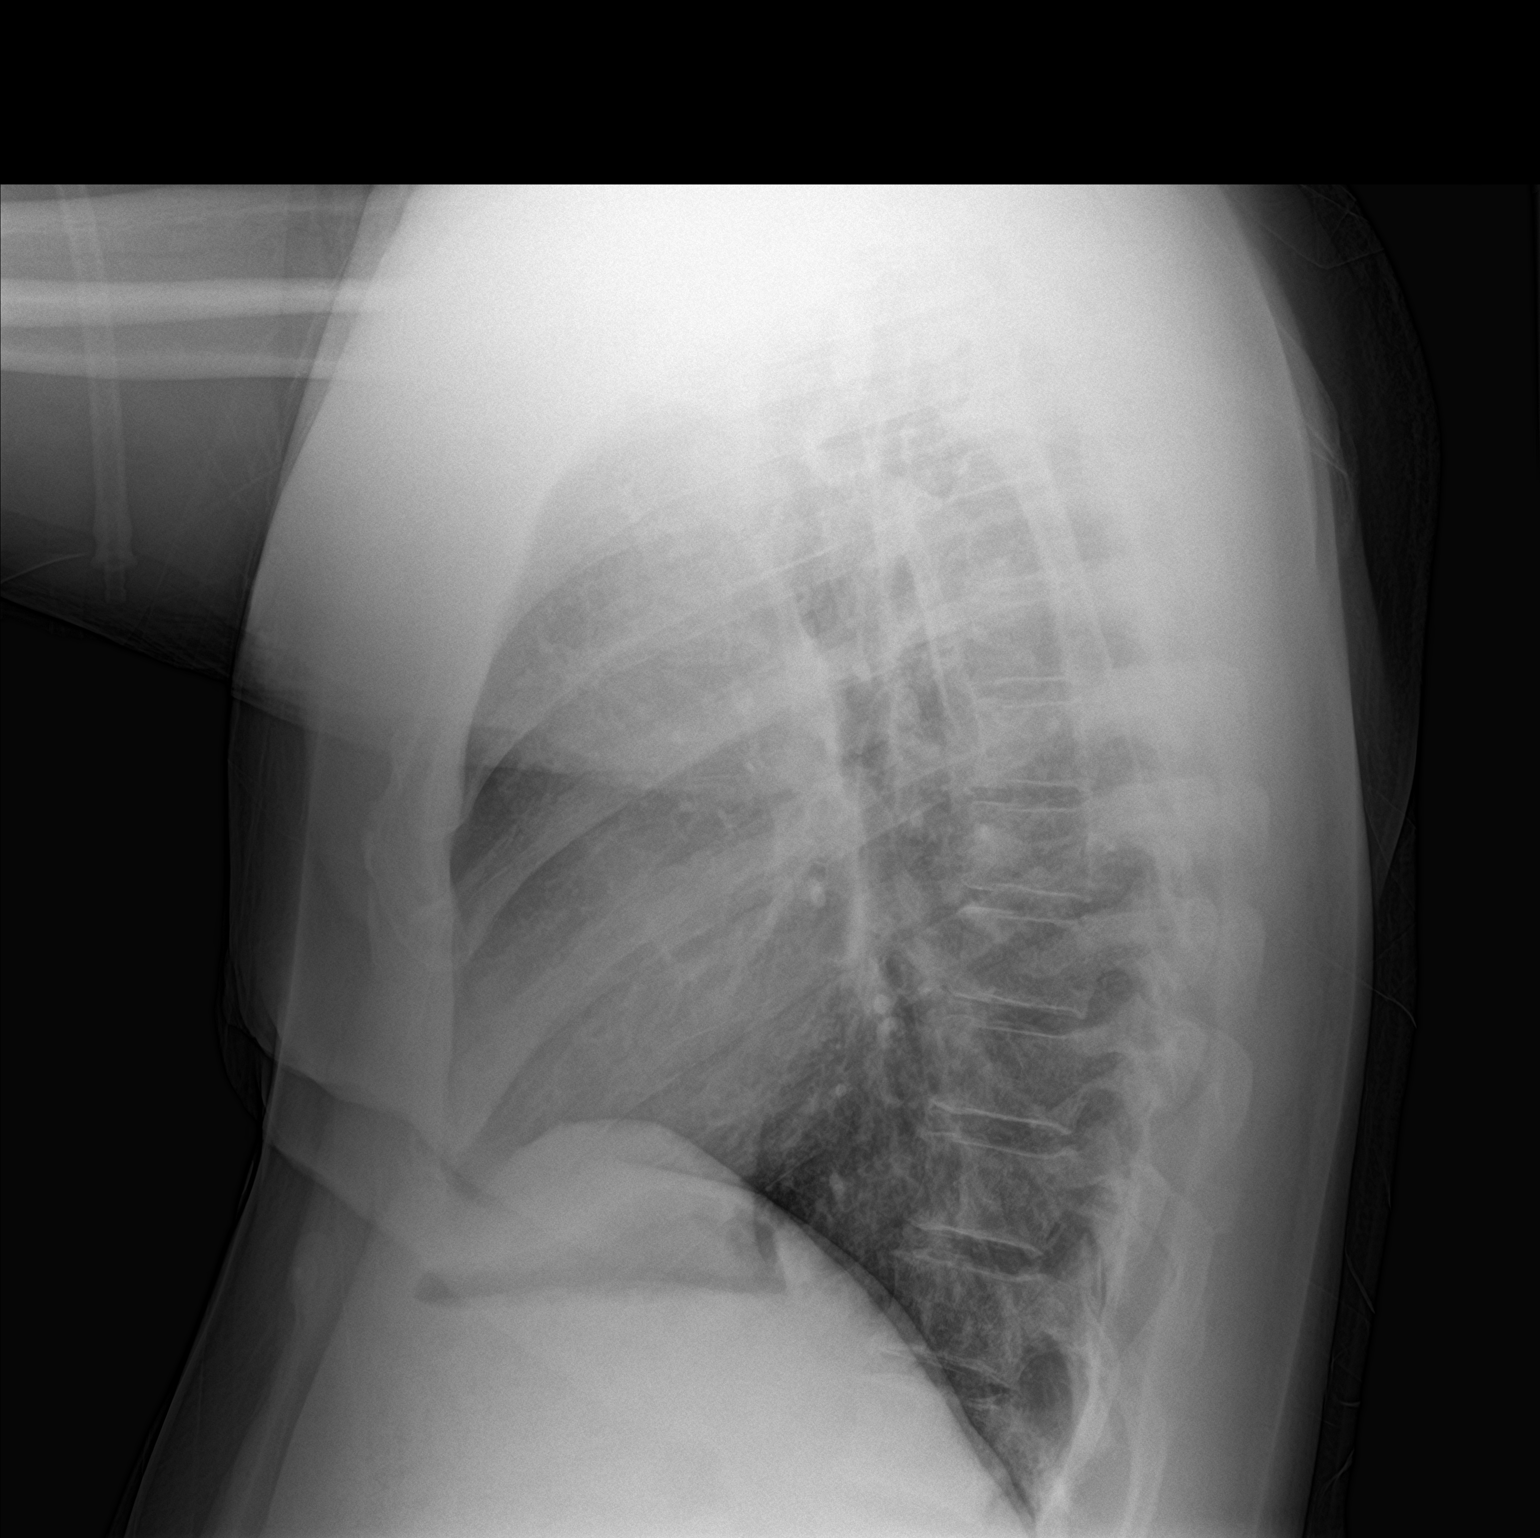

[2 of 2 positions shown; findings below may reference images not displayed]

FINDINGS: The cardiomediastinal contours are normal. The lungs are clear.
Pulmonary vasculature is normal. No consolidation, pleural effusion,
or pneumothorax. No acute osseous abnormalities are seen.
IMPRESSION: No acute abnormality.

## 2017-12-22 ENCOUNTER — Encounter (HOSPITAL_COMMUNITY): Payer: Self-pay

## 2017-12-22 ENCOUNTER — Emergency Department (HOSPITAL_COMMUNITY): Payer: Self-pay

## 2017-12-22 ENCOUNTER — Emergency Department (HOSPITAL_COMMUNITY)
Admission: EM | Admit: 2017-12-22 | Discharge: 2017-12-23 | Disposition: A | Discharge status: Home / Self Care | Payer: Self-pay | Attending: Emergency Medicine | Admitting: Emergency Medicine

## 2017-12-22 ENCOUNTER — Other Ambulatory Visit: Payer: Self-pay

## 2017-12-22 DIAGNOSIS — Z5321 Procedure and treatment not carried out due to patient leaving prior to being seen by health care provider: Secondary | ICD-10-CM | POA: Insufficient documentation

## 2017-12-22 DIAGNOSIS — R079 Chest pain, unspecified: Secondary | ICD-10-CM | POA: Insufficient documentation

## 2017-12-22 LAB — CBC
HEMATOCRIT: 44.9 % (ref 39.0–52.0)
Hemoglobin: 14.8 g/dL (ref 13.0–17.0)
MCH: 25.9 pg — AB (ref 26.0–34.0)
MCHC: 33 g/dL (ref 30.0–36.0)
MCV: 78.6 fL (ref 78.0–100.0)
Platelets: 195 10*3/uL (ref 150–400)
RBC: 5.71 MIL/uL (ref 4.22–5.81)
RDW: 13.2 % (ref 11.5–15.5)
WBC: 5.9 10*3/uL (ref 4.0–10.5)

## 2017-12-22 LAB — BASIC METABOLIC PANEL
Anion gap: 11 (ref 5–15)
BUN: 11 mg/dL (ref 6–20)
CO2: 23 mmol/L (ref 22–32)
Calcium: 8.6 mg/dL — ABNORMAL LOW (ref 8.9–10.3)
Chloride: 98 mmol/L — ABNORMAL LOW (ref 101–111)
Creatinine, Ser: 1.16 mg/dL (ref 0.61–1.24)
GFR calc Af Amer: >60 mL/min (ref >60)
GLUCOSE: 419 mg/dL — AB (ref 65–99)
POTASSIUM: 3.6 mmol/L (ref 3.5–5.1)
Sodium: 132 mmol/L — ABNORMAL LOW (ref 135–145)

## 2017-12-22 LAB — I-STAT TROPONIN, ED: Troponin i, poc: 0 ng/mL (ref 0.00–0.08)

## 2017-12-22 NOTE — ED Triage Notes (Signed)
Pt states that he began to have L sided CP today with no associated cardiac symptoms.

## 2017-12-23 NOTE — ED Notes (Signed)
Pt called,no answer.

## 2017-12-23 NOTE — ED Notes (Signed)
No answer in WR x3

## 2017-12-23 NOTE — ED Notes (Signed)
No answer in WR x 2

## 2018-06-29 ENCOUNTER — Emergency Department (HOSPITAL_COMMUNITY)
Admission: EM | Admit: 2018-06-29 | Discharge: 2018-06-29 | Disposition: A | Discharge status: Home / Self Care | Payer: Self-pay | Attending: Emergency Medicine | Admitting: Emergency Medicine

## 2018-06-29 ENCOUNTER — Encounter (HOSPITAL_COMMUNITY): Payer: Self-pay | Admitting: Emergency Medicine

## 2018-06-29 ENCOUNTER — Other Ambulatory Visit: Payer: Self-pay

## 2018-06-29 DIAGNOSIS — F17200 Nicotine dependence, unspecified, uncomplicated: Secondary | ICD-10-CM | POA: Insufficient documentation

## 2018-06-29 DIAGNOSIS — Z79899 Other long term (current) drug therapy: Secondary | ICD-10-CM | POA: Insufficient documentation

## 2018-06-29 DIAGNOSIS — I10 Essential (primary) hypertension: Secondary | ICD-10-CM | POA: Insufficient documentation

## 2018-06-29 DIAGNOSIS — E119 Type 2 diabetes mellitus without complications: Secondary | ICD-10-CM | POA: Insufficient documentation

## 2018-06-29 DIAGNOSIS — Z7982 Long term (current) use of aspirin: Secondary | ICD-10-CM | POA: Insufficient documentation

## 2018-06-29 DIAGNOSIS — Z0130 Encounter for examination of blood pressure without abnormal findings: Secondary | ICD-10-CM | POA: Insufficient documentation

## 2018-06-29 DIAGNOSIS — Z794 Long term (current) use of insulin: Secondary | ICD-10-CM | POA: Insufficient documentation

## 2018-06-29 NOTE — ED Triage Notes (Signed)
Pt presents to ED for assessment after getting his shot for "mental health stuff" today and being told he was hypertensive at his appoint (174/106).  Patient denies CP, headache, dizziness, blurred vision.  Pt normo-tensive at triage.

## 2018-06-29 NOTE — Discharge Instructions (Addendum)
Your blood pressure today was 101/77, within normal limits.If you experience any headache, chest pain or shortness of breath please return to the ED.

## 2018-06-29 NOTE — ED Provider Notes (Signed)
[facility] Provider Note   CSN: [id number] Arrival date & time: 06/29/18  1227     History   Chief Complaint Chief Complaint  Patient presents with  . Hypertension    HPI Connor Cook is a 37 y.o. male.  37 y/o male with a PMH of Psychosis, DM presents to the ED with a chief complaint of elevated blood pressure. Patient reports he was given a shot for "mental health stuff" when the nurse told him his blood pressure was elevated. He denies any headache, chest pain or shortness of breath.      Past Medical History:  Diagnosis Date  . Aspiration pneumonia ([place])    History of  . Diabetes mellitus dx dec 2008   ,type1 , dx in 2008 with an episode of DKA  . H/O: upper GI bleed   . History of acute pancreatitis   . History of acute renal failure   . Psychosis ([place])    receive fluhenazine injections every 2 weeks at [facility]: contact is Cecile Sheerer, labeled as Psychotic disorder NOS.    Patient Active Problem List   Diagnosis Date Noted  . Hallucinations   . Paranoid schizophrenia ([place]) 04/04/2017  . Marijuana abuse, continuous 04/04/2017  . Alcohol use disorder 04/04/2017  . TOBACCO ABUSE 05/07/2010  . 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 DYSLIPIDEMIA 08/15/2008  . HYPERTENSION 11/21/2007  . PERSONAL HISTORY OF AFFECTIVE DISORDER 11/14/2007  . DIABETES MELLITUS, TYPE I 11/13/2007  . PANCREATITIS, HX OF 11/13/2007  . GASTROINTESTINAL HEMORRHAGE, HX OF 11/13/2007    History reviewed. No pertinent surgical history.      Home Medications    Prior to Admission medications   Medication Sig Start Date End Date Taking? Authorizing Provider  aspirin EC 81 MG tablet Take 1 tablet (81 mg total) by mouth daily. 04/04/17   Ladell Pier, MD  atorvastatin (LIPITOR) 10 MG tablet Take 1 tablet (10 mg total) by mouth daily. 07/21/17   Ladell Pier, MD  Blood Glucose Monitoring Suppl (TRUE METRIX METER) w/Device KIT  Use as directed 07/21/17   Ladell Pier, MD  gabapentin (NEURONTIN) 100 MG capsule Take 2 capsules (200 mg total) by mouth 2 (two) times daily. 04/23/17   Patrecia Pour, NP  glipiZIDE (GLUCOTROL XL) 10 MG 24 hr tablet Take 1 tablet (10 mg total) by mouth daily with breakfast. 07/21/17   Ladell Pier, MD  glucose blood (TRUE METRIX BLOOD GLUCOSE TEST) test strip Use as instructed 07/21/17   Ladell Pier, MD  Insulin Glargine (LANTUS SOLOSTAR) 100 UNIT/ML Solostar Pen Inject 16 Units into the skin daily at 10 pm. 07/21/17   Ladell Pier, MD  Insulin Pen Needle (PEN NEEDLES) 32G X 5 MM MISC 1 Bottle by Does not apply route daily. 07/21/17   Ladell Pier, MD  Insulin Syringe-Needle U-100 (GLOBAL INSULIN SYRINGES) 30G X 1/2" 0.3 ML MISC Use as directed 04/04/17   Ladell Pier, MD  metFORMIN (GLUCOPHAGE) 500 MG tablet Take 1 tablet (500 mg total) by mouth 2 (two) times daily with a meal. 07/21/17   Ladell Pier, MD  paliperidone (INVEGA) 9 MG 24 hr tablet Take 1 tablet (9 mg total) by mouth daily. 04/24/17   Patrecia Pour, NP  TRUEPLUS LANCETS 28G MISC Use as directed 07/21/17   Ladell Pier, MD    Family History Family History  Problem Relation Age of Onset  . Heart disease Father   .  Diabetes Father   . Hypertension Mother   . Diabetes Mother   . Diabetes Maternal Aunt   . Diabetes Maternal Uncle   . Mental illness Paternal Uncle   . Diabetes Maternal Grandmother     Social History Social History   Tobacco Use  . Smoking status: Current Every Day Smoker    Packs/day: 1.00    Years: 3.00    Pack years: 3.00  . Smokeless tobacco: Never Used  Substance Use Topics  . Alcohol use: Yes    Comment: two 40 oz daily  . Drug use: No     Allergies   Patient has no known allergies.   Review of Systems Review of Systems  Constitutional: Negative for chills and fever.  Respiratory: Negative for shortness of breath.   Cardiovascular: Negative for  chest pain.  Neurological: Negative for headaches.     Physical Exam Updated Vital Signs BP 101/77   Pulse 75   Temp 98.2 F (36.8 C) (Oral)   Resp 18   SpO2 97%   Physical Exam  Constitutional: He is oriented to person, place, and time. He appears well-developed and well-nourished.  HENT:  Head: Normocephalic and atraumatic.  Eyes: Pupils are equal, round, and reactive to light.  Neck: Normal range of motion. Neck supple.  Cardiovascular: Normal heart sounds.  Pulmonary/Chest: Breath sounds normal.  Abdominal: Soft.  Musculoskeletal: He exhibits no tenderness or deformity.  Neurological: He is alert and oriented to person, place, and time.  Skin: Skin is warm and dry.  Nursing note and vitals reviewed.    ED Treatments / Results  Labs (all labs ordered are listed, but only abnormal results are displayed) Labs Reviewed - No data to display  EKG None  Radiology No results found.  Procedures Procedures (including critical care time)  Medications Ordered in ED Medications - No data to display   Initial Impression / Assessment and Plan / ED Course  I have reviewed the triage vital signs and the nursing notes.  Pertinent labs & imaging results that were available during my care of the patient were reviewed by me and considered in my medical decision making (see chart for details).    Patient presents with normal blood pressure after being told his blood pressure was elevated at his "mental health appointment:" Patient is any headache, chest pain, shortness of breath or other complaints.  At this time his blood pressure has been checked twice during his ED visit with levels being within normal limits at 101/77.  I have also checked his blood pressure myself at 105/80 while in the room.  Patient denies any complaints at this time patient will be discharged home.  Return precautions provided.  Final Clinical Impressions(s) / ED Diagnoses   Final diagnoses:  Blood  pressure check    ED Discharge Orders    None       Janeece Fitting, PA-C 06/29/18 1316    Hayden Rasmussen, MD 06/30/18 [phone]

## 2018-09-05 IMAGING — DX DG CHEST 2V
2 series · 2 of 2 positions shown · non-contrast
Comparison: 03/10/2017

CLINICAL DATA: Chest pain

EXAM:
CHEST  2 VIEW

[chest pa]
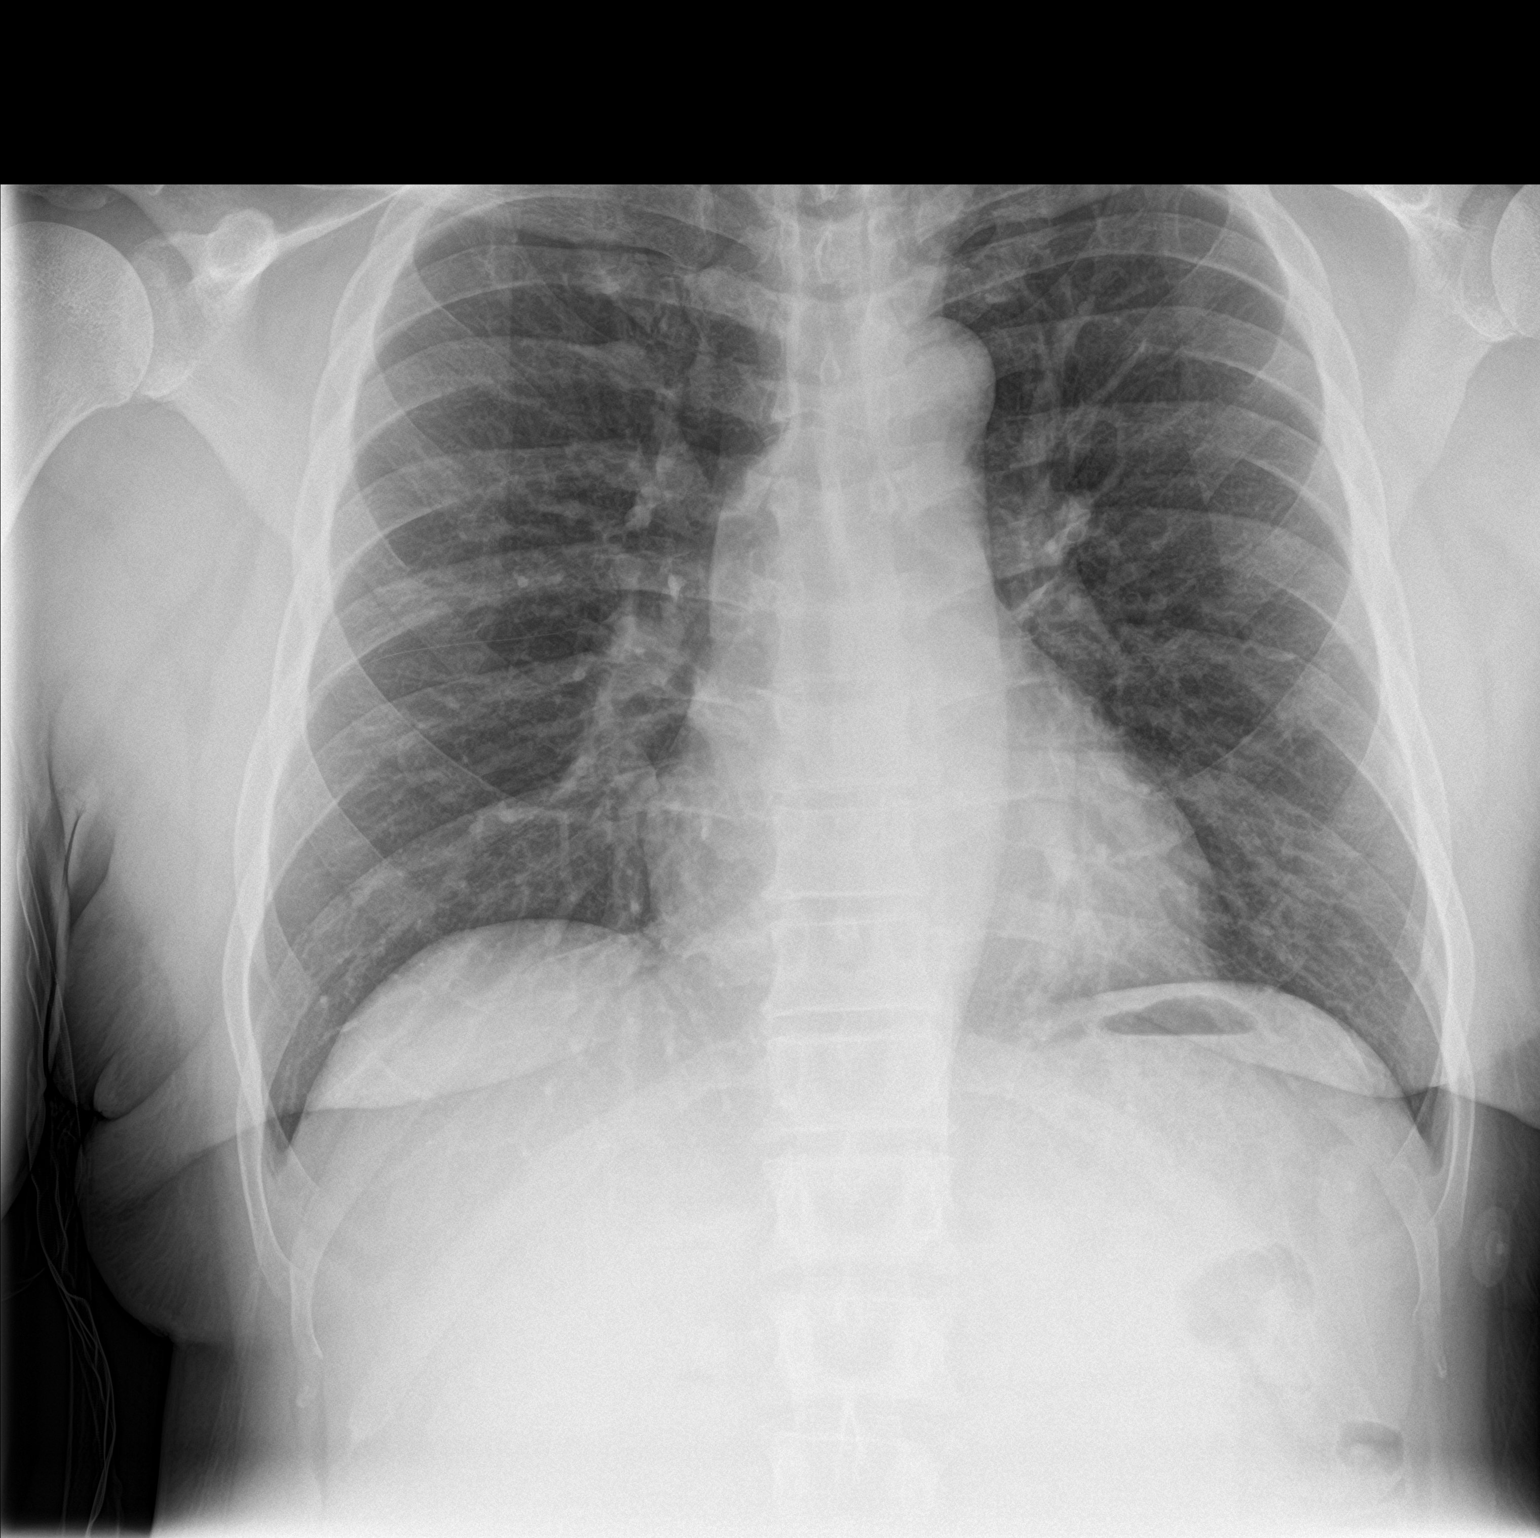

[chest lat]
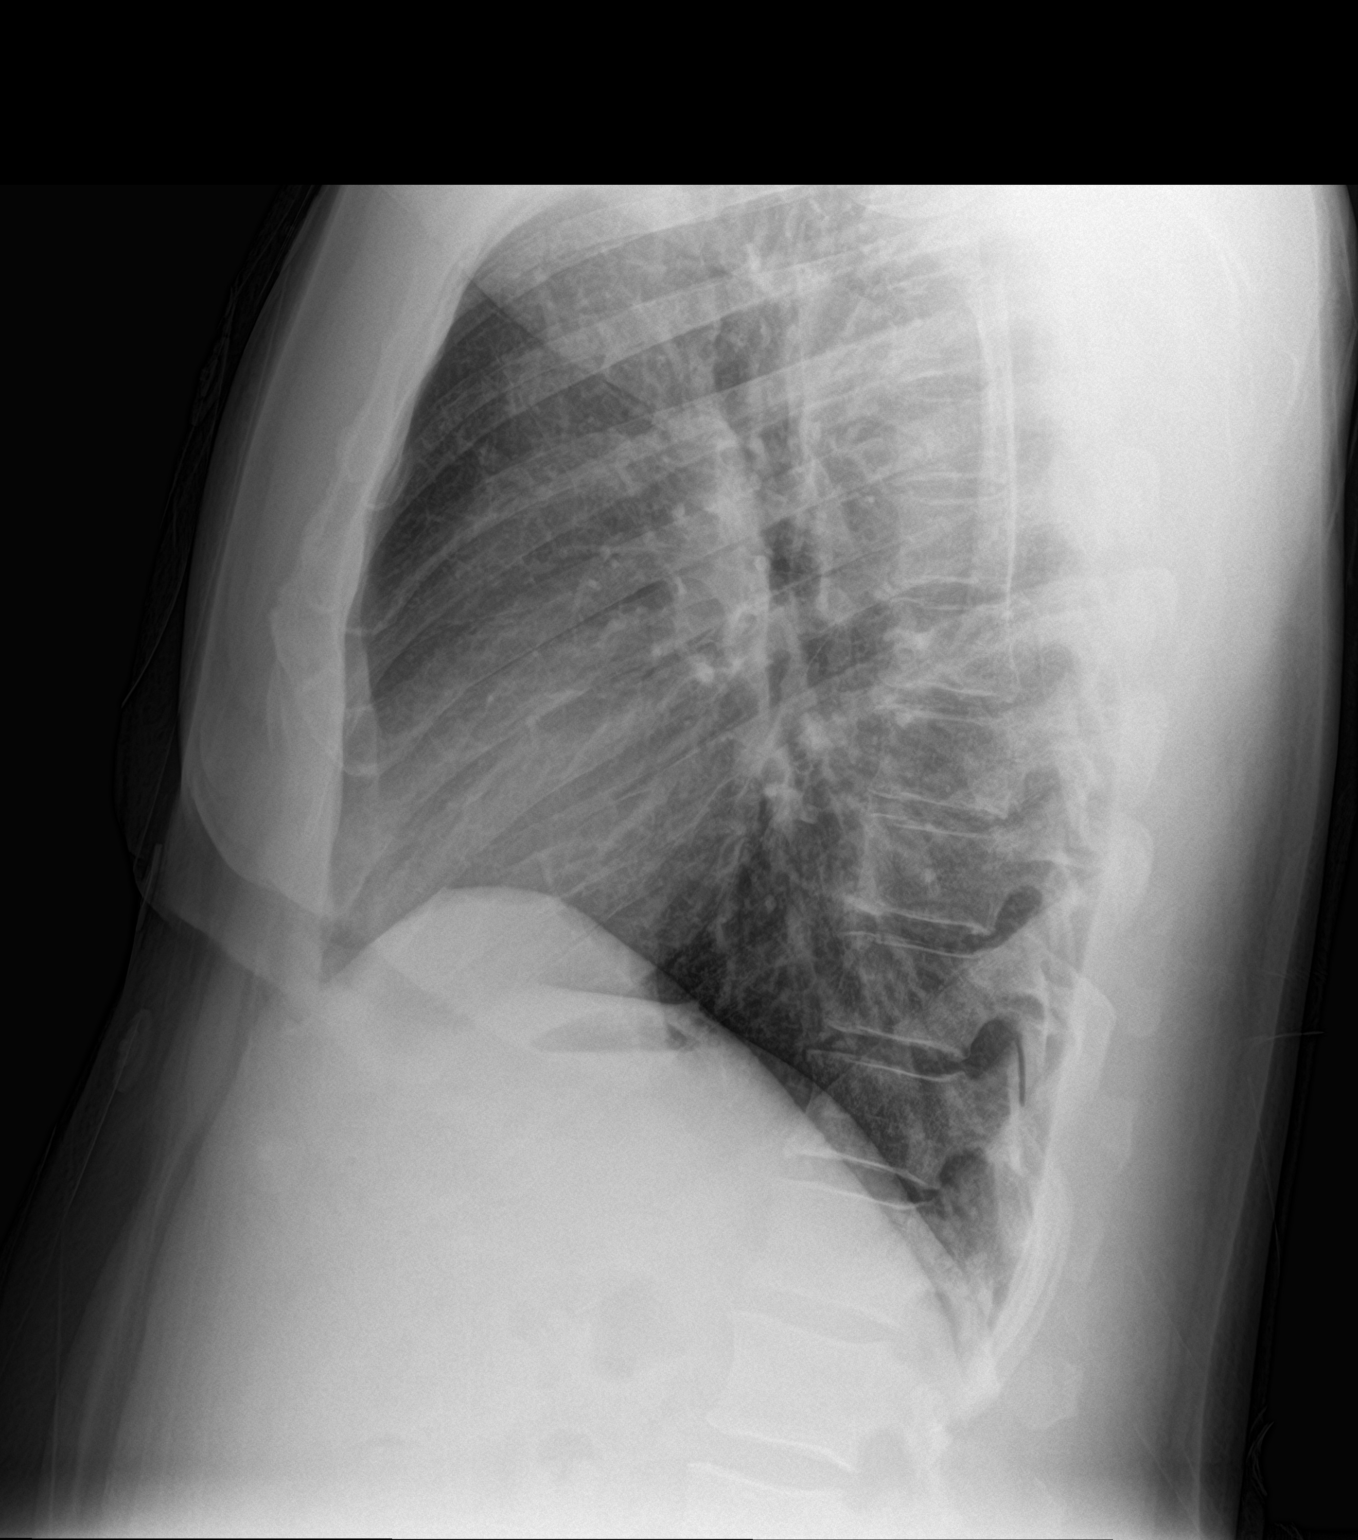

[2 of 2 positions shown; findings below may reference images not displayed]

FINDINGS: The heart size and mediastinal contours are within normal limits.
Both lungs are clear. The visualized skeletal structures are
unremarkable. Mild bronchitic changes.
IMPRESSION: No active cardiopulmonary disease.

## 2019-05-20 ENCOUNTER — Ambulatory Visit: Payer: Self-pay | Admitting: Internal Medicine

## 2019-06-20 ENCOUNTER — Ambulatory Visit: Payer: Self-pay | Admitting: Internal Medicine
